# Patient Record
Sex: Female | Born: 1995 | Race: White | Hispanic: No | Marital: Single | Smoking: Never smoker
Health system: Southern US, Community
[De-identification: ages and names within clinical notes are randomized; demographics above are authoritative.]

## PROBLEM LIST (undated history)

## (undated) DIAGNOSIS — F32A Depression, unspecified: Secondary | ICD-10-CM

## (undated) DIAGNOSIS — F909 Attention-deficit hyperactivity disorder, unspecified type: Secondary | ICD-10-CM

## (undated) DIAGNOSIS — F419 Anxiety disorder, unspecified: Secondary | ICD-10-CM

## (undated) DIAGNOSIS — N83209 Unspecified ovarian cyst, unspecified side: Secondary | ICD-10-CM

## (undated) HISTORY — PX: WISDOM TOOTH EXTRACTION: SHX21

## (undated) HISTORY — DX: Depression, unspecified: F32.A

## (undated) HISTORY — DX: Anxiety disorder, unspecified: F41.9

## (undated) HISTORY — DX: Unspecified ovarian cyst, unspecified side: N83.209

## (undated) HISTORY — DX: Attention-deficit hyperactivity disorder, unspecified type: F90.9

## (undated) HISTORY — PX: TONSILLECTOMY: SUR1361

---
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
## 2016-05-24 ENCOUNTER — Encounter: Payer: 59 | Admitting: Women's Health

## 2016-05-31 ENCOUNTER — Encounter: Payer: 59 | Admitting: Women's Health

## 2016-06-05 ENCOUNTER — Telehealth: Payer: Self-pay

## 2016-06-05 ENCOUNTER — Encounter: Payer: Self-pay | Admitting: Women's Health

## 2016-06-05 ENCOUNTER — Ambulatory Visit (INDEPENDENT_AMBULATORY_CARE_PROVIDER_SITE_OTHER): Payer: 59 | Admitting: Women's Health

## 2016-06-05 VITALS — BP 116/74 | Ht 70.0 in | Wt 197.0 lb

## 2016-06-05 DIAGNOSIS — Z3009 Encounter for other general counseling and advice on contraception: Secondary | ICD-10-CM | POA: Diagnosis not present

## 2016-06-05 DIAGNOSIS — Z01419 Encounter for gynecological examination (general) (routine) without abnormal findings: Secondary | ICD-10-CM | POA: Diagnosis not present

## 2016-06-05 LAB — CBC WITH DIFFERENTIAL/PLATELET
Basophils Absolute: 0 cells/uL (ref 0–200)
Basophils Relative: 0 %
EOS PCT: 3 %
Eosinophils Absolute: 138 cells/uL (ref 15–500)
HEMATOCRIT: 41.2 % (ref 35.0–45.0)
HEMOGLOBIN: 13.8 g/dL (ref 11.7–15.5)
LYMPHS ABS: 1748 {cells}/uL (ref 850–3900)
Lymphocytes Relative: 38 %
MCH: 29.4 pg (ref 27.0–33.0)
MCHC: 33.5 g/dL (ref 32.0–36.0)
MCV: 87.8 fL (ref 80.0–100.0)
MONO ABS: 506 {cells}/uL (ref 200–950)
MPV: 10.4 fL (ref 7.5–12.5)
Monocytes Relative: 11 %
NEUTROS ABS: 2208 {cells}/uL (ref 1500–7800)
NEUTROS PCT: 48 %
Platelets: 301 10*3/uL (ref 140–400)
RBC: 4.69 MIL/uL (ref 3.80–5.10)
RDW: 13.1 % (ref 11.0–15.0)
WBC: 4.6 10*3/uL (ref 3.8–10.8)

## 2016-06-05 MED ORDER — ETONOGESTREL-ETHINYL ESTRADIOL 0.12-0.015 MG/24HR VA RING: VAGINAL_RING | VAGINAL | 4 refills | Status: DC

## 2016-06-05 NOTE — Progress Notes (Signed)
[facility] 05/10/1996 [id number]    History:    Presents for annual exam.  Monthly cycle on NuvaRing without complaint. Same partner negative STD screen. Gardasil series completed.  Past medical history, past surgical history, family history and social history were all reviewed and documented in the EPIC chart. 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   ROS:  A ROS was performed and pertinent positives and negatives are included.  Exam:  Vitals:   06/05/16 1002  BP: 116/74  Weight: 197 lb (89.4 kg)  Height: 5\' 10"  (1.778 m)   Body mass index is 28.27 kg/m.   General appearance:  Normal Thyroid:  Symmetrical, normal in size, without palpable masses or nodularity. Respiratory  Auscultation:  Clear without wheezing or rhonchi Cardiovascular  Auscultation:  Regular rate, without rubs, murmurs or gallops  Edema/varicosities:  Not grossly evident Abdominal  Soft,nontender, without masses, guarding or rebound.  Liver/spleen:  No organomegaly noted  Hernia:  None appreciated  Skin  Inspection:  Grossly normal   Breasts: Examined lying and sitting.     Right: Without masses, retractions, discharge or axillary adenopathy.     Left: Without masses, retractions, discharge or axillary adenopathy. Gentitourinary   Inguinal/mons:  Normal without inguinal adenopathy  External genitalia:  Normal  BUS/Urethra/Skene's glands:  Normal  Vagina:  Normal  Cervix:  Normal  Uterus:   normal in size, shape and contour.  Midline and mobile  Adnexa/parametria:     Rt: Without masses or tenderness.   Lt: Without masses or tenderness.  Anus and perineum: Normal    Assessment/Plan:  20 y.o. S WF G0 for annual exam with no complaints.  Monthly cycle on NuvaRing Contraception management  Plan: Contraception options reviewed would like to try Nexplanon will check coverage Dr. Lily PeerFernandez place with next cycle. SBE's, exercise, calcium rich diet, MVI daily encouraged. Campus safety  reviewed. Will continue NuvaRing until Nexplanon placed. CBC, UHarrington Challenger.    Keishawna Carranza [facility], 10:23 AM 06/05/2016

## 2016-06-05 NOTE — Patient Instructions (Signed)
Health Maintenance, Female Adopting a healthy lifestyle and getting preventive care can go a long way to promote health and wellness. Talk with your health care provider about what schedule of regular examinations is right for you. This is a good chance for you to check in with your provider about disease prevention and staying healthy. In between checkups, there are plenty of things you can do on your own. Experts have done a lot of research about which lifestyle changes and preventive measures are most likely to keep you healthy. Ask your health care provider for more information. WEIGHT AND DIET  Eat a healthy diet  Be sure to include plenty of vegetables, fruits, low-fat dairy products, and lean protein.  Do not eat a lot of foods high in solid fats, added sugars, or salt.  Get regular exercise. This is one of the most important things you can do for your health.  Most adults should exercise for at least 150 minutes each week. The exercise should increase your heart rate and make you sweat (moderate-intensity exercise).  Most adults should also do strengthening exercises at least twice a week. This is in addition to the moderate-intensity exercise.  Maintain a healthy weight  Body mass index (BMI) is a measurement that can be used to identify possible weight problems. It estimates body fat based on height and weight. Your health care provider can help determine your BMI and help you achieve or maintain a healthy weight.  For females 20 years of age and older:   A BMI below 18.5 is considered underweight.  A BMI of 18.5 to 24.9 is normal.  A BMI of 25 to 29.9 is considered overweight.  A BMI of 30 and above is considered obese.  Watch levels of cholesterol and blood lipids  You should start having your blood tested for lipids and cholesterol at 20 years of age, then have this test every 5 years.  You may need to have your cholesterol levels checked more often if:  Your lipid  or cholesterol levels are high.  You are older than 19 years of age.  You are at high risk for heart disease.  CANCER SCREENING   Lung Cancer  Lung cancer screening is recommended for adults 55-80 years old who are at high risk for lung cancer because of a history of smoking.  A yearly low-dose CT scan of the lungs is recommended for people who:  Currently smoke.  Have quit within the past 15 years.  Have at least a 30-pack-year history of smoking. A pack year is smoking an average of one pack of cigarettes a day for 1 year.  Yearly screening should continue until it has been 15 years since you quit.  Yearly screening should stop if you develop a health problem that would prevent you from having lung cancer treatment.  Breast Cancer  Practice breast self-awareness. This means understanding how your breasts normally appear and feel.  It also means doing regular breast self-exams. Let your health care provider know about any changes, no matter how small.  If you are in your 20s or 30s, you should have a clinical breast exam (CBE) by a health care provider every 1-3 years as part of a regular health exam.  If you are 40 or older, have a CBE every year. Also consider having a breast X-ray (mammogram) every year.  If you have a family history of breast cancer, talk to your health care provider about genetic screening.  If you   are at high risk for breast cancer, talk to your health care provider about having an MRI and a mammogram every year.  Breast cancer gene (BRCA) assessment is recommended for women who have family members with BRCA-related cancers. BRCA-related cancers include:  Breast.  Ovarian.  Tubal.  Peritoneal cancers.  Results of the assessment will determine the need for genetic counseling and BRCA1 and BRCA2 testing. Cervical Cancer Your health care provider may recommend that you be screened regularly for cancer of the pelvic organs (ovaries, uterus, and  vagina). This screening involves a pelvic examination, including checking for microscopic changes to the surface of your cervix (Pap test). You may be encouraged to have this screening done every 3 years, beginning at age 21.  For women ages 30-65, health care providers may recommend pelvic exams and Pap testing every 3 years, or they may recommend the Pap and pelvic exam, combined with testing for human papilloma virus (HPV), every 5 years. Some types of HPV increase your risk of cervical cancer. Testing for HPV may also be done on women of any age with unclear Pap test results.  Other health care providers may not recommend any screening for nonpregnant women who are considered low risk for pelvic cancer and who do not have symptoms. Ask your health care provider if a screening pelvic exam is right for you.  If you have had past treatment for cervical cancer or a condition that could lead to cancer, you need Pap tests and screening for cancer for at least 20 years after your treatment. If Pap tests have been discontinued, your risk factors (such as having a new sexual partner) need to be reassessed to determine if screening should resume. Some women have medical problems that increase the chance of getting cervical cancer. In these cases, your health care provider may recommend more frequent screening and Pap tests. Colorectal Cancer  This type of cancer can be detected and often prevented.  Routine colorectal cancer screening usually begins at 19 years of age and continues through 19 years of age.  Your health care provider may recommend screening at an earlier age if you have risk factors for colon cancer.  Your health care provider may also recommend using home test kits to check for hidden blood in the stool.  A small camera at the end of a tube can be used to examine your colon directly (sigmoidoscopy or colonoscopy). This is done to check for the earliest forms of colorectal  cancer.  Routine screening usually begins at age 50.  Direct examination of the colon should be repeated every 5-10 years through 19 years of age. However, you may need to be screened more often if early forms of precancerous polyps or small growths are found. Skin Cancer  Check your skin from head to toe regularly.  Tell your health care provider about any new moles or changes in moles, especially if there is a change in a mole's shape or color.  Also tell your health care provider if you have a mole that is larger than the size of a pencil eraser.  Always use sunscreen. Apply sunscreen liberally and repeatedly throughout the day.  Protect yourself by wearing long sleeves, pants, a wide-brimmed hat, and sunglasses whenever you are outside. HEART DISEASE, DIABETES, AND HIGH BLOOD PRESSURE   High blood pressure causes heart disease and increases the risk of stroke. High blood pressure is more likely to develop in:  People who have blood pressure in the high end   of the normal range (130-139/85-89 mm Hg).  People who are overweight or obese.  People who are African American.  If you are 38-23 years of age, have your blood pressure checked every 3-5 years. If you are 61 years of age or older, have your blood pressure checked every year. You should have your blood pressure measured twice--once when you are at a hospital or clinic, and once when you are not at a hospital or clinic. Record the average of the two measurements. To check your blood pressure when you are not at a hospital or clinic, you can use:  An automated blood pressure machine at a pharmacy.  A home blood pressure monitor.  If you are between 45 years and 39 years old, ask your health care provider if you should take aspirin to prevent strokes.  Have regular diabetes screenings. This involves taking a blood sample to check your fasting blood sugar level.  If you are at a normal weight and have a low risk for diabetes,  have this test once every three years after 19 years of age.  If you are overweight and have a high risk for diabetes, consider being tested at a younger age or more often. PREVENTING INFECTION  Hepatitis B  If you have a higher risk for hepatitis B, you should be screened for this virus. You are considered at high risk for hepatitis B if:  You were born in a country where hepatitis B is common. Ask your health care provider which countries are considered high risk.  Your parents were born in a high-risk country, and you have not been immunized against hepatitis B (hepatitis B vaccine).  You have HIV or AIDS.  You use needles to inject street drugs.  You live with someone who has hepatitis B.  You have had sex with someone who has hepatitis B.  You get hemodialysis treatment.  You take certain medicines for conditions, including cancer, organ transplantation, and autoimmune conditions. Hepatitis C  Blood testing is recommended for:  Everyone born from [birth year] through 1964.  Anyone with known risk factors for hepatitis C. Sexually transmitted infections (STIs)  You should be screened for sexually transmitted infections (STIs) including gonorrhea and chlamydia if:  You are sexually active and are younger than 19 years of age.  You are older than 19 years of age and your health care provider tells you that you are at risk for this type of infection.  Your sexual activity has changed since you were last screened and you are at an increased risk for chlamydia or gonorrhea. Ask your health care provider if you are at risk.  If you do not have HIV, but are at risk, it may be recommended that you take a prescription medicine daily to prevent HIV infection. This is called pre-exposure prophylaxis (PrEP). You are considered at risk if:  You are sexually active and do not regularly use condoms or know the HIV status of your partner(s).  You take drugs by injection.  You are sexually  active with a partner who has HIV. Talk with your health care provider about whether you are at high risk of being infected with HIV. If you choose to begin PrEP, you should first be tested for HIV. You should then be tested every 3 months for as long as you are taking PrEP.  PREGNANCY   If you are premenopausal and you may become pregnant, ask your health care provider about preconception counseling.  If you may  become pregnant, take 400 to 800 micrograms (mcg) of folic acid every day.  If you want to prevent pregnancy, talk to your health care provider about birth control (contraception). OSTEOPOROSIS AND MENOPAUSE   Osteoporosis is a disease in which the bones lose minerals and strength with aging. This can result in serious bone fractures. Your risk for osteoporosis can be identified using a bone density scan.  If you are 33 years of age or older, or if you are at risk for osteoporosis and fractures, ask your health care provider if you should be screened.  Ask your health care provider whether you should take a calcium or vitamin D supplement to lower your risk for osteoporosis.  Menopause may have certain physical symptoms and risks.  Hormone replacement therapy may reduce some of these symptoms and risks. Talk to your health care provider about whether hormone replacement therapy is right for you.  HOME CARE INSTRUCTIONS   Schedule regular health, dental, and eye exams.  Stay current with your immunizations.   Do not use any tobacco products including cigarettes, chewing tobacco, or electronic cigarettes.  If you are pregnant, do not drink alcohol.  If you are breastfeeding, limit how much and how often you drink alcohol.  Limit alcohol intake to no more than 1 drink per day for nonpregnant women. One drink equals 12 ounces of beer, 5 ounces of wine, or 1 ounces of hard liquor.  Do not use street drugs.  Do not share needles.  Ask your health care provider for help if  you need support or information about quitting drugs.  Tell your health care provider if you often feel depressed.  Tell your health care provider if you have ever been abused or do not feel safe at home.   This information is not intended to replace advice given to you by your health care provider. Make sure you discuss any questions you have with your health care provider.   Document Released: 04/15/2011 Document Revised: 10/21/2014 Document Reviewed: 09/01/2013 Elsevier Interactive Patient Education 2016 [place] What is this medicine? ETONOGESTREL (et oh noe JES trel) is a contraceptive (birth control) device. It is used to prevent pregnancy. It can be used for up to 3 years. This medicine may be used for other purposes; ask your health care provider or pharmacist if you have questions. What should I tell my health care provider before I take this medicine? They need to know if you have any of these conditions: -abnormal vaginal bleeding -blood vessel disease or blood clots -cancer of the breast, cervix, or liver -depression -diabetes -gallbladder disease -headaches -heart disease or recent heart attack -high blood pressure -high cholesterol -kidney disease -liver disease -renal disease -seizures -tobacco smoker -an unusual or allergic reaction to etonogestrel, other hormones, anesthetics or antiseptics, medicines, foods, dyes, or preservatives -pregnant or trying to get pregnant -breast-feeding How should I use this medicine? This device is inserted just under the skin on the inner side of your upper arm by a health care professional. Talk to your pediatrician regarding the use of this medicine in children. Special care may be needed. Overdosage: If you think you have taken too much of this medicine contact a poison control center or emergency room at once. NOTE: This medicine is only for you. Do not share this medicine with others. What if I  miss a dose? This does not apply. What may interact with this medicine? Do not take this medicine with any of the  following medications: -amprenavir -bosentan -fosamprenavir This medicine may also interact with the following medications: -barbiturate medicines for inducing sleep or treating seizures -certain medicines for fungal infections like ketoconazole and itraconazole -griseofulvin -medicines to treat seizures like carbamazepine, felbamate, oxcarbazepine, phenytoin, topiramate -modafinil -phenylbutazone -rifampin -some medicines to treat HIV infection like atazanavir, indinavir, lopinavir, nelfinavir, tipranavir, ritonavir -St. John's wort This list may not describe all possible interactions. Give your health care provider a list of all the medicines, herbs, non-prescription drugs, or dietary supplements you use. Also tell them if you smoke, drink alcohol, or use illegal drugs. Some items may interact with your medicine. What should I watch for while using this medicine? This product does not protect you against HIV infection (AIDS) or other sexually transmitted diseases. You should be able to feel the implant by pressing your fingertips over the skin where it was inserted. Contact your doctor if you cannot feel the implant, and use a non-hormonal birth control method (such as condoms) until your doctor confirms that the implant is in place. If you feel that the implant may have broken or become bent while in your arm, contact your healthcare provider. What side effects may I notice from receiving this medicine? Side effects that you should report to your doctor or health care professional as soon as possible: -allergic reactions like skin rash, itching or hives, swelling of the face, lips, or tongue -breast lumps -changes in emotions or moods -depressed mood -heavy or prolonged menstrual bleeding -pain, irritation, swelling, or bruising at the insertion site -scar at site of  insertion -signs of infection at the insertion site such as fever, and skin redness, pain or discharge -signs of pregnancy -signs and symptoms of a blood clot such as breathing problems; changes in vision; chest pain; severe, sudden headache; pain, swelling, warmth in the leg; trouble speaking; sudden numbness or weakness of the face, arm or leg -signs and symptoms of liver injury like dark yellow or brown urine; general ill feeling or flu-like symptoms; light-colored stools; loss of appetite; nausea; right upper belly pain; unusually weak or tired; yellowing of the eyes or skin -unusual vaginal bleeding, discharge -signs and symptoms of a stroke like changes in vision; confusion; trouble speaking or understanding; severe headaches; sudden numbness or weakness of the face, arm or leg; trouble walking; dizziness; loss of balance or coordination Side effects that usually do not require medical attention (Report these to your doctor or health care professional if they continue or are bothersome.): -acne -back pain -breast pain -changes in weight -dizziness -general ill feeling or flu-like symptoms -headache -irregular menstrual bleeding -nausea -sore throat -vaginal irritation or inflammation This list may not describe all possible side effects. Call your doctor for medical advice about side effects. You may report side effects to FDA at 1-800-FDA-1088. Where should I keep my medicine? This drug is given in a hospital or clinic and will not be stored at home. NOTE: This sheet is a summary. It may not cover all possible information. If you have questions about this medicine, talk to your doctor, pharmacist, or health care provider.    2016, Elsevier/Gold Standard. (2014-07-15 14:07:06)

## 2016-06-05 NOTE — Telephone Encounter (Signed)
I called patient and left message that [facility] and they cover the Nexplanon [phone] and insertion ([phone]) at 100%.  No precert required. [facility] 06[facility]16 Call ref [phone]

## 2016-06-06 LAB — URINALYSIS W MICROSCOPIC + REFLEX CULTURE
Bacteria, UA: NONE SEEN [HPF]
Bilirubin Urine: NEGATIVE
CASTS: NONE SEEN [LPF]
CRYSTALS: NONE SEEN [HPF]
Glucose, UA: NEGATIVE
HGB URINE DIPSTICK: NEGATIVE
Ketones, ur: NEGATIVE
Leukocytes, UA: NEGATIVE
NITRITE: NEGATIVE
PH: 6.5 (ref 5.0–8.0)
Protein, ur: NEGATIVE
RBC / HPF: NONE SEEN RBC/HPF (ref <=2)
SPECIFIC GRAVITY, URINE: 1.009 (ref 1.001–1.035)
Squamous Epithelial / LPF: NONE SEEN [HPF] (ref <=5)
WBC, UA: NONE SEEN WBC/HPF (ref <=5)
YEAST: NONE SEEN [HPF]

## 2016-07-02 ENCOUNTER — Encounter: Payer: Self-pay | Admitting: Gynecology

## 2016-07-02 ENCOUNTER — Ambulatory Visit (INDEPENDENT_AMBULATORY_CARE_PROVIDER_SITE_OTHER): Payer: 59 | Admitting: Gynecology

## 2016-07-02 VITALS — BP 118/80 | Ht 70.0 in | Wt 197.0 lb

## 2016-07-02 DIAGNOSIS — Z975 Presence of (intrauterine) contraceptive device: Secondary | ICD-10-CM | POA: Insufficient documentation

## 2016-07-02 DIAGNOSIS — Z30017 Encounter for initial prescription of implantable subdermal contraceptive: Secondary | ICD-10-CM | POA: Diagnosis not present

## 2016-07-02 NOTE — Patient Instructions (Signed)
Etonogestrel implant What is this medicine? ETONOGESTREL (et oh noe JES trel) is a contraceptive (birth control) device. It is used to prevent pregnancy. It can be used for up to 3 years. This medicine may be used for other purposes; ask your health care provider or pharmacist if you have questions. What should I tell my health care provider before I take this medicine? They need to know if you have any of these conditions: -abnormal vaginal bleeding -blood vessel disease or blood clots -cancer of the breast, cervix, or liver -depression -diabetes -gallbladder disease -headaches -heart disease or recent heart attack -high blood pressure -high cholesterol -kidney disease -liver disease -renal disease -seizures -tobacco smoker -an unusual or allergic reaction to etonogestrel, other hormones, anesthetics or antiseptics, medicines, foods, dyes, or preservatives -pregnant or trying to get pregnant -breast-feeding How should I use this medicine? This device is inserted just under the skin on the inner side of your upper arm by a health care professional. Talk to your pediatrician regarding the use of this medicine in children. Special care may be needed. Overdosage: If you think you have taken too much of this medicine contact a poison control center or emergency room at once. NOTE: This medicine is only for you. Do not share this medicine with others. What if I miss a dose? This does not apply. What may interact with this medicine? Do not take this medicine with any of the following medications: -amprenavir -bosentan -fosamprenavir This medicine may also interact with the following medications: -barbiturate medicines for inducing sleep or treating seizures -certain medicines for fungal infections like ketoconazole and itraconazole -griseofulvin -medicines to treat seizures like carbamazepine, felbamate, oxcarbazepine, phenytoin,  topiramate -modafinil -phenylbutazone -rifampin -some medicines to treat HIV infection like atazanavir, indinavir, lopinavir, nelfinavir, tipranavir, ritonavir -St. John's wort This list may not describe all possible interactions. Give your health care provider a list of all the medicines, herbs, non-prescription drugs, or dietary supplements you use. Also tell them if you smoke, drink alcohol, or use illegal drugs. Some items may interact with your medicine. What should I watch for while using this medicine? This product does not protect you against HIV infection (AIDS) or other sexually transmitted diseases. You should be able to feel the implant by pressing your fingertips over the skin where it was inserted. Contact your doctor if you cannot feel the implant, and use a non-hormonal birth control method (such as condoms) until your doctor confirms that the implant is in place. If you feel that the implant may have broken or become bent while in your arm, contact your healthcare provider. What side effects may I notice from receiving this medicine? Side effects that you should report to your doctor or health care professional as soon as possible: -allergic reactions like skin rash, itching or hives, swelling of the face, lips, or tongue -breast lumps -changes in emotions or moods -depressed mood -heavy or prolonged menstrual bleeding -pain, irritation, swelling, or bruising at the insertion site -scar at site of insertion -signs of infection at the insertion site such as fever, and skin redness, pain or discharge -signs of pregnancy -signs and symptoms of a blood clot such as breathing problems; changes in vision; chest pain; severe, sudden headache; pain, swelling, warmth in the leg; trouble speaking; sudden numbness or weakness of the face, arm or leg -signs and symptoms of liver injury like dark yellow or brown urine; general ill feeling or flu-like symptoms; light-colored stools; loss of  appetite; nausea; right upper belly   pain; unusually weak or tired; yellowing of the eyes or skin -unusual vaginal bleeding, discharge -signs and symptoms of a stroke like changes in vision; confusion; trouble speaking or understanding; severe headaches; sudden numbness or weakness of the face, arm or leg; trouble walking; dizziness; loss of balance or coordination Side effects that usually do not require medical attention (Report these to your doctor or health care professional if they continue or are bothersome.): -acne -back pain -breast pain -changes in weight -dizziness -general ill feeling or flu-like symptoms -headache -irregular menstrual bleeding -nausea -sore throat -vaginal irritation or inflammation This list may not describe all possible side effects. Call your doctor for medical advice about side effects. You may report side effects to FDA at 1-800-FDA-1088. Where should I keep my medicine? This drug is given in a hospital or clinic and will not be stored at home. NOTE: This sheet is a summary. It may not cover all possible information. If you have questions about this medicine, talk to your doctor, pharmacist, or health care provider.    2016, Elsevier/Gold Standard. (2014-07-15 14:07:06)  

## 2016-07-02 NOTE — Progress Notes (Signed)
   Patient is a 20 year old who presented to the office today for placement of the Nexplanon for contraception. She had been using the NuvaRing in the past but did not want to remember to have to change the reading every month. She completed the Gardasil series in the past she's currently menstruating. Should been provided with literature information on this form of contraception. She is fully where this could for 3 years. The risks benefits and pros and cons were discussed and literature information was provided. She was offered the flu vaccine today but declined.                                              Nexplanon Procedure Note (insertion)     The patient was laying on her back with her nondominant arm (left) flexed at the elbow and externally rotated. The insertion site was identified as the underside of the nondominant upper arm approximately 8 cm from the medial epicondyle of the humerus. 2 marks were made with a sterile marker: The first marked the spot where the Nexplanon  implant was to be inserted, and a second, marked a spot a few centimeters proximal to the first marke to guide the direction of the insertion. The area was cleansed with Betadine solution. The area was anesthetized with 1% lidocaine  (1 cc)  at the area the injection site and underneath the skin along the planned insertion tunnel. The preloaded disposable Nexplanon was removed from its sterile casing.  The applicator was held above the needle at the textured surface area. The transparent protector was removed. With a freehand, the skin was stretched around the insertion site with a thumb and index finger. The skin was then punctured with the tip of the needle angled at 30. The Nexplanon applicator was lowered to a horizontal position. While lifting the skin with the tip of the needle the needle was then slid to its full length. The applicator was kept in sitting position with a needle inserted to its full length. The  purple slider was unlocked by pushing it slightly downward. The slider was fully moved back until it stopped. This allowed the implant to be in the final subdermal position and the needle was locked inside the body of the applicator. The applicator was then removed. A Steri-Strip was made over the incision and a Kerlix wrap was placed which patient is to remove tomorrow. No complications patient tolerated procedure well and was released home with instructions.   [facility] Memorial HospitalFERNANDEZ,Ambreen Tufte HMD3:45 PMTD@

## 2016-07-03 ENCOUNTER — Encounter: Payer: Self-pay | Admitting: Anesthesiology

## 2017-02-26 ENCOUNTER — Encounter: Payer: Self-pay | Admitting: Gynecology

## 2017-06-06 ENCOUNTER — Encounter: Payer: 59 | Admitting: Women's Health

## 2017-06-10 ENCOUNTER — Ambulatory Visit (INDEPENDENT_AMBULATORY_CARE_PROVIDER_SITE_OTHER): Payer: 59 | Admitting: Women's Health

## 2017-06-10 ENCOUNTER — Encounter: Payer: Self-pay | Admitting: Women's Health

## 2017-06-10 VITALS — BP 122/80 | Ht 70.0 in | Wt 206.0 lb

## 2017-06-10 DIAGNOSIS — Z01419 Encounter for gynecological examination (general) (routine) without abnormal findings: Secondary | ICD-10-CM | POA: Diagnosis not present

## 2017-06-10 DIAGNOSIS — Z113 Encounter for screening for infections with a predominantly sexual mode of transmission: Secondary | ICD-10-CM | POA: Diagnosis not present

## 2017-06-10 LAB — URINALYSIS W MICROSCOPIC + REFLEX CULTURE
Bacteria, UA: NONE SEEN [HPF]
Bilirubin Urine: NEGATIVE
CRYSTALS: NONE SEEN [HPF]
Casts: NONE SEEN [LPF]
Glucose, UA: NEGATIVE
Hgb urine dipstick: NEGATIVE
Ketones, ur: NEGATIVE
Leukocytes, UA: NEGATIVE
Nitrite: NEGATIVE
PROTEIN: NEGATIVE
SPECIFIC GRAVITY, URINE: 1.022 (ref 1.001–1.035)
YEAST: NONE SEEN [HPF]
pH: 6.5 (ref 5.0–8.0)

## 2017-06-10 NOTE — Patient Instructions (Signed)

## 2017-06-10 NOTE — Progress Notes (Signed)
Selena Murray 12-18-1995 [id number]    History:    Presents for annual exam.  Rare bleeding with Nexplanon placed 06/2016. Gardasil series completed. Not sexually active, requests STD screen.  Past medical history, past surgical history, family history and social history were all reviewed and documented in the EPIC chart. Inherited some money from her grandfather and bought a home and is doing well. 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  ROS:  A ROS was performed and pertinent positives and negatives are included.  Exam:  Vitals:   06/10/17 0839  BP: 122/80  Weight: 206 lb (93.4 kg)  Height: 5\' 10"  (1.778 m)   Body mass index is 29.56 kg/m.   General appearance:  Normal Thyroid:  Symmetrical, normal in size, without palpable masses or nodularity. Respiratory  Auscultation:  Clear without wheezing or rhonchi Cardiovascular  Auscultation:  Regular rate, without rubs, murmurs or gallops  Edema/varicosities:  Not grossly evident Abdominal  Soft,nontender, without masses, guarding or rebound.  Liver/spleen:  No organomegaly noted  Hernia:  None appreciated  Skin  Inspection:  Grossly normal   Breasts: Examined lying and sitting.     Right: Without masses, retractions, discharge or axillary adenopathy.     Left: Without masses, retractions, discharge or axillary adenopathy. Gentitourinary   Inguinal/mons:  Normal without inguinal adenopathy  External genitalia:  Normal  BUS/Urethra/Skene's glands:  Normal  Vagina:  Normal  Cervix:  Normal  Uterus: normal in size, shape and contour.  Midline and mobile  Adnexa/parametria:     Rt: Without masses or tenderness.   Lt: Without masses or tenderness.  Anus and perineum: Normal    Assessment/Plan:  21 y.o. S WF G0 for annual exam with no complaints.  06/2016 Nexplanon rare spotting STD screen Obesity  Plan: SBE's, exercise, calcium rich diet, MVI daily encouraged, reviewed importance of decreasing  calories, increasing exercise for health. CBC, GC/Chlamydia, HIV, hep B, C, RPR. Reviewed importance of condoms until permanent partner. Campus safety reviewed.  Harrington Challenger [facility], 9:32 AM 06/10/2017

## 2017-06-11 LAB — URINE CULTURE: Organism ID, Bacteria: NO GROWTH

## 2017-06-11 LAB — GC/CHLAMYDIA PROBE AMP
CT Probe RNA: NOT DETECTED
GC Probe RNA: NOT DETECTED

## 2018-06-17 ENCOUNTER — Ambulatory Visit: Payer: 59 | Admitting: Women's Health

## 2018-06-17 ENCOUNTER — Encounter: Payer: Self-pay | Admitting: Women's Health

## 2018-06-17 VITALS — BP 116/74 | Ht 69.0 in | Wt 197.0 lb

## 2018-06-17 DIAGNOSIS — Z01419 Encounter for gynecological examination (general) (routine) without abnormal findings: Secondary | ICD-10-CM

## 2018-06-17 DIAGNOSIS — Z113 Encounter for screening for infections with a predominantly sexual mode of transmission: Secondary | ICD-10-CM | POA: Diagnosis not present

## 2018-06-17 NOTE — Addendum Note (Signed)
Addended by: Dayna Barker on: 06/17/2018 09:33 AM   Modules accepted: Orders

## 2018-06-17 NOTE — Patient Instructions (Signed)

## 2018-06-17 NOTE — Progress Notes (Signed)
Selena Murray May 13, 1996 [id number]    History:    Presents for annual exam.  06/2016 Nexplanon with rare spotting.  Gardasil series completed.  New Partner  Past medical history, past surgical history, family history and social history were all reviewed and documented in the EPIC chart.  Attending school [employer] education.  Working night shift at [employer].  Parents healthy.  ROS:  A ROS was performed and pertinent positives and negatives are included.  Exam:  Vitals:   06/17/18 0856  BP: 116/74  Weight: 197 lb (89.4 kg)  Height: 5\' 9"  (1.753 m)   Body mass index is 29.09 kg/m.   General appearance:  Normal Thyroid:  Symmetrical, normal in size, without palpable masses or nodularity. Respiratory  Auscultation:  Clear without wheezing or rhonchi Cardiovascular  Auscultation:  Regular rate, without rubs, murmurs or gallops  Edema/varicosities:  Not grossly evident Abdominal  Soft,nontender, without masses, guarding or rebound.  Liver/spleen:  No organomegaly noted  Hernia:  None appreciated  Skin  Inspection:  Grossly normal   Breasts: Examined lying and sitting.     Right: Without masses, retractions, discharge or axillary adenopathy.     Left: Without masses, retractions, discharge or axillary adenopathy. Gentitourinary   Inguinal/mons:  Normal without inguinal adenopathy  External genitalia:  Normal  BUS/Urethra/Skene's glands:  Normal  Vagina:  Normal  Cervix:  Normal  Uterus:   normal in size, shape and contour.  Midline and mobile  Adnexa/parametria:     Rt: Without masses or tenderness.   Lt: Without masses or tenderness.  Anus and perineum: Normal    Assessment/Plan:  22 y.o. SWF G0 for annual exam no complaints.  06/2016 Nexplanon rare spotting STD screen  Plan: SBE's, exercise, calcium rich foods, MVI daily encouraged.  Congratulated on 10 pound weight loss continue to watch simple carbs and calories.  Safety reviewed.  CBC, GC/chlamydia, HIV, hep B,  C, RPR.  Pap.   Harrington Challenger [facility], 9:05 AM 06/17/2018

## 2018-06-18 ENCOUNTER — Other Ambulatory Visit: Payer: Self-pay | Admitting: Women's Health

## 2018-06-18 LAB — CBC WITH DIFFERENTIAL/PLATELET
BASOS PCT: 0.6 %
Basophils Absolute: 40 cells/uL (ref 0–200)
Eosinophils Absolute: 121 cells/uL (ref 15–500)
Eosinophils Relative: 1.8 %
HCT: 41.9 % (ref 35.0–45.0)
Hemoglobin: 14.2 g/dL (ref 11.7–15.5)
LYMPHS ABS: 3142 {cells}/uL (ref 850–3900)
MCH: 29.6 pg (ref 27.0–33.0)
MCHC: 33.9 g/dL (ref 32.0–36.0)
MCV: 87.5 fL (ref 80.0–100.0)
MPV: 10.5 fL (ref 7.5–12.5)
Monocytes Relative: 8.4 %
NEUTROS ABS: 2834 {cells}/uL (ref 1500–7800)
NEUTROS PCT: 42.3 %
PLATELETS: 336 10*3/uL (ref 140–400)
RBC: 4.79 10*6/uL (ref 3.80–5.10)
RDW: 11.8 % (ref 11.0–15.0)
TOTAL LYMPHOCYTE: 46.9 %
WBC mixed population: 563 cells/uL (ref 200–950)
WBC: 6.7 10*3/uL (ref 3.8–10.8)

## 2018-06-18 LAB — C. TRACHOMATIS/N. GONORRHOEAE RNA
C. TRACHOMATIS RNA, TMA: DETECTED — AB
N. gonorrhoeae RNA, TMA: NOT DETECTED

## 2018-06-18 LAB — HEPATITIS C ANTIBODY
Hepatitis C Ab: NONREACTIVE
SIGNAL TO CUT-OFF: 0.04 (ref <1.00)

## 2018-06-18 LAB — HEPATITIS B SURFACE ANTIGEN: HEP B S AG: NONREACTIVE

## 2018-06-18 LAB — RPR: RPR: NONREACTIVE

## 2018-06-18 LAB — HIV ANTIBODY (ROUTINE TESTING W REFLEX): HIV: NONREACTIVE

## 2018-06-18 MED ORDER — AZITHROMYCIN 500 MG PO TABS: 1000.0000 mg | ORAL_TABLET | Freq: Once | ORAL | 0 refills | Status: AC

## 2018-06-20 LAB — PAP IG W/ RFLX HPV ASCU

## 2018-06-20 LAB — HUMAN PAPILLOMAVIRUS, HIGH RISK: HPV DNA High Risk: NOT DETECTED

## 2018-07-08 ENCOUNTER — Ambulatory Visit: Payer: 59 | Admitting: Women's Health

## 2018-07-08 ENCOUNTER — Encounter: Payer: Self-pay | Admitting: Women's Health

## 2018-07-08 VITALS — BP 120/82

## 2018-07-08 DIAGNOSIS — Z202 Contact with and (suspected) exposure to infections with a predominantly sexual mode of transmission: Secondary | ICD-10-CM | POA: Diagnosis not present

## 2018-07-08 MED ORDER — AZITHROMYCIN 500 MG PO TABS: 1000.0000 mg | ORAL_TABLET | Freq: Every day | ORAL | 0 refills | Status: DC

## 2018-07-08 NOTE — Patient Instructions (Signed)
Chlamydia, Female Chlamydia is an STD (sexually transmitted disease). This is an infection that spreads through sexual contact. If it is not treated, it can cause serious problems. It must be treated with antibiotic medicine. Sometimes, you may not have symptoms (asymptomatic). When you have symptoms, they can include:  Burning when you pee (urinate).  Peeing often.  Fluid (discharge) coming from the vagina.  Redness, soreness, and swelling (inflammation) of the butt (rectum).  Bleeding or fluid coming from the butt.  Belly (abdominal) pain.  Pain during sex.  Bleeding between periods.  Itching, burning, or redness in the eyes.  Fluid coming from the eyes.  Follow these instructions at home: Medicines  Take over-the-counter and prescription medicines only as told by your doctor.  Take your antibiotic medicine as told by your doctor. Do not stop taking the antibiotic even if you start to feel better. Sexual activity  Tell sex partners about your infection. Sex partners are people you had oral, anal, or vaginal sex with within 60 days of when you started getting sick. They need treatment, too.  Do not have sex until: ? You and your sex partners have been treated. ? Your doctor says it is okay.  If you have a single dose treatment, wait 7 days before having sex. General instructions  It is up to you to get your test results. Ask your doctor when your results will be ready.  Get a lot of rest.  Eat healthy foods.  Drink enough fluid to keep your pee (urine) clear or pale yellow.  Keep all follow-up visits as told by your doctor. You may need tests after 3 months. Preventing chlamydia  The only way to prevent chlamydia is not to have sex. To lower your risk: ? Use latex condoms correctly. Do this every time you have sex. ? Avoid having many sex partners. ? Ask if your partner has been tested for STDs and if he or she had negative results. Contact a doctor if:  You  get new symptoms.  You do not get better with treatment.  You have a fever or chills.  You have pain during sex. Get help right away if:  Your pain gets worse and does not get better with medicine.  You get flu-like symptoms, such as: ? Night sweats. ? Sore throat. ? Muscle aches.  You feel sick to your stomach (nauseous).  You throw up (vomit).  You have trouble swallowing.  You have bleeding: ? Between periods. ? After sex.  You have irregular periods.  You have belly pain that does not get better with medicine.  You have lower back pain that does not get better with medicine.  You feel weak or dizzy.  You pass out (faint).  You are pregnant and you get symptoms of chlamydia. Summary  Chlamydia is an infection that spreads through sexual contact.  Sometimes, chlamydia can cause no symptoms (asymptomatic).  Do not have sex until your doctor says it is okay.  All sex partners will have to be treated for chlamydia. This information is not intended to replace advice given to you by your health care provider. Make sure you discuss any questions you have with your health care provider. Document Released: 07/09/2008 Document Revised: 09/19/2016 Document Reviewed: 09/19/2016 Elsevier Interactive Patient Education  2017 Elsevier Inc.  

## 2018-07-08 NOTE — Progress Notes (Signed)
22 year old S WF G0 presents for test to cure chlamydia, was treated 3 weeks ago, had intercourse with partner last week, condom came off several days prior to partner being treated.  States thinks she was reexposed, states unable to get through to cancel appointment today.  Denies abdominal pain, discharge, urinary symptoms or fever.  Chlamydia found at annual exam, asymptomatic.  Nexplanon rare bleeding.  Exam: Appears well.  Probable re-exposure to chlamydia  Plan: Zithromax 1 g. x1 dose return to office in 3 weeks for test of cure, reviewed importance of abstaining until test of cure.

## 2018-07-28 ENCOUNTER — Encounter: Payer: Self-pay | Admitting: Women's Health

## 2018-07-28 ENCOUNTER — Ambulatory Visit (INDEPENDENT_AMBULATORY_CARE_PROVIDER_SITE_OTHER): Payer: 59 | Admitting: Women's Health

## 2018-07-28 VITALS — BP 126/80

## 2018-07-28 DIAGNOSIS — Z202 Contact with and (suspected) exposure to infections with a predominantly sexual mode of transmission: Secondary | ICD-10-CM | POA: Diagnosis not present

## 2018-07-28 NOTE — Addendum Note (Signed)
Addended by: Becky Sax on: 07/28/2018 11:55 AM   Modules accepted: Orders

## 2018-07-28 NOTE — Progress Notes (Signed)
22 year old S WF G0 00 presents for test of cure chlamydia.  Has abstained since treatment.  Had negative HIV, hepatitis and RPR.  Chlamydia had been asymptomatic found on annual exam.  Is no longer with past partner.  06/2016 Nexplanon has occasional irregular spotting.  No known health problems.  Gardasil series completed.  Exam: Appears well.  External genitalia within normal limits, speculum exam moderate amount of menses type blood noted, GC/Chlamydia culture taken.  Bimanual no CMT or adnexal tenderness.  Test of cure chlamydia  Plan: GC/Chlamydia culture pending.  Reviewed importance of condoms, safe dating reviewed.

## 2018-07-29 LAB — C. TRACHOMATIS/N. GONORRHOEAE RNA
C. TRACHOMATIS RNA, TMA: NOT DETECTED
N. gonorrhoeae RNA, TMA: NOT DETECTED

## 2018-08-01 ENCOUNTER — Emergency Department (HOSPITAL_COMMUNITY)
Admission: EM | Admit: 2018-08-01 | Discharge: 2018-08-01 | Disposition: A | Discharge status: Home / Self Care | Payer: 59 | Attending: Emergency Medicine | Admitting: Emergency Medicine

## 2018-08-01 ENCOUNTER — Encounter (HOSPITAL_COMMUNITY): Payer: Self-pay | Admitting: *Deleted

## 2018-08-01 ENCOUNTER — Other Ambulatory Visit: Payer: Self-pay

## 2018-08-01 DIAGNOSIS — J069 Acute upper respiratory infection, unspecified: Secondary | ICD-10-CM | POA: Diagnosis not present

## 2018-08-01 DIAGNOSIS — R590 Localized enlarged lymph nodes: Secondary | ICD-10-CM | POA: Insufficient documentation

## 2018-08-01 DIAGNOSIS — R05 Cough: Secondary | ICD-10-CM | POA: Diagnosis present

## 2018-08-01 DIAGNOSIS — J029 Acute pharyngitis, unspecified: Secondary | ICD-10-CM | POA: Insufficient documentation

## 2018-08-01 DIAGNOSIS — H9203 Otalgia, bilateral: Secondary | ICD-10-CM | POA: Insufficient documentation

## 2018-08-01 LAB — GROUP A STREP BY PCR: GROUP A STREP BY PCR: NOT DETECTED

## 2018-08-01 MED ORDER — IBUPROFEN 800 MG PO TABS: 800.0000 mg | ORAL_TABLET | Freq: Three times a day (TID) | ORAL | 0 refills | Status: DC

## 2018-08-01 MED ORDER — BENZONATATE 100 MG PO CAPS: 100.0000 mg | ORAL_CAPSULE | Freq: Three times a day (TID) | ORAL | 0 refills | Status: DC

## 2018-08-01 MED ORDER — FLUTICASONE PROPIONATE 50 MCG/ACT NA SUSP: 1.0000 | Freq: Every day | NASAL | 0 refills | Status: DC

## 2018-08-01 NOTE — ED Provider Notes (Signed)
[facility]-EMERGENCY DEPT Provider Note   CSN: [id number] Arrival date & time: 08/01/18  0131     History   Chief Complaint Chief Complaint  Patient presents with  . Sore Throat    HPI Selena Murray is a 22 y.o. female with a history of ADHD, ovarian cyst, who is s/p tonsillectomy presenting to the ED with complaints of URI symptoms for the past 5 days.  Patient reports congestion, rhinorrhea, sinus pressure, bilateral ear pressure, sore throat, and dry cough.  Calves are constant, somewhat alleviated by NyQuil which she took earlier this evening.  Discomfort in the throat is mild in severity.  No other specific alleviating or aggravating factors.  No other interventions trialed.  She reports some subjective fevers, no measured temps.  Denies nausea, vomiting, chest pain, dyspnea, drooling, or change in voice.   HPI  Past Medical History:  Diagnosis Date  . ADHD (attention deficit hyperactivity disorder)   . Ovarian cyst     Patient Active Problem List   Diagnosis Date Noted  . Nexplanon in place 07/02/2016    Past Surgical History:  Procedure Laterality Date  . TONSILLECTOMY    . WISDOM TOOTH EXTRACTION       OB History    Gravida  0   Para      Term      Preterm      AB      Living        SAB      TAB      Ectopic      Multiple      Live Births               Home Medications    Prior to Admission medications   Medication Sig Start Date End Date Taking? Authorizing Provider  etonogestrel (NEXPLANON) 68 MG IMPL implant 1 each by Subdermal route once.    [provider]    Family History Family History  Problem Relation Age of Onset  . Heart disease Maternal Grandmother   . Hyperlipidemia Maternal Grandmother   . Hypertension Maternal Grandmother     Social History Social History   Tobacco Use  . Smoking status: Never Smoker  . Smokeless tobacco: Never Used  Substance Use Topics  . Alcohol  use: Yes    Comment: Social  . Drug use: No     Allergies   Gold-containing drug products   Review of Systems Review of Systems  Constitutional: Negative for chills and fever.  HENT: Positive for congestion, ear pain (pressure), rhinorrhea, sinus pressure and sore throat. Negative for drooling, trouble swallowing and voice change.   Respiratory: Positive for cough. Negative for shortness of breath.   Cardiovascular: Negative for chest pain.  Gastrointestinal: Negative for nausea and vomiting.     Physical Exam Updated Vital Signs BP 118/73   Pulse 81   Temp 98.4 F (36.9 C) (Oral)   Resp 16   LMP 07/21/2018   SpO2 100%   Physical Exam  Constitutional: She appears well-developed and well-nourished. No distress.  HENT:  Head: Normocephalic and atraumatic.  Right Ear: No tenderness. No mastoid tenderness. Tympanic membrane is not perforated, not erythematous, not retracted and not bulging.  Left Ear: No tenderness. No mastoid tenderness. Tympanic membrane is not perforated, not erythematous, not retracted and not bulging.  Nose: Mucosal edema and rhinorrhea present.  Mouth/Throat: Uvula is midline. Posterior oropharyngeal erythema present. No oropharyngeal exudate.  Posterior oropharynx is symmetric  appearing. Patient tolerating own secretions without difficulty. No trismus. No drooling. No hot potato voice. No swelling beneath the tongue, submandibular compartment is soft.   Eyes: Pupils are equal, round, and reactive to light. Conjunctivae are normal. Right eye exhibits no discharge. Left eye exhibits no discharge.  Neck: Normal range of motion. Neck supple. No neck rigidity. No edema and no erythema present.  Cardiovascular: Normal rate and regular rhythm.  No murmur heard. Pulmonary/Chest: Breath sounds normal. No respiratory distress. She has no wheezes. She has no rales.  Abdominal: Soft. She exhibits no distension. There is no tenderness.  Lymphadenopathy:    She has  cervical adenopathy (mild anterior).  Neurological: She is alert.  Skin: Skin is warm and dry. No rash noted.  Psychiatric: She has a normal mood and affect. Her behavior is normal.  Nursing note and vitals reviewed.   ED Treatments / Results  Labs Results for orders placed or performed during the hospital encounter of 08/01/18  Group A Strep by PCR  Result Value Ref Range   Group A Strep by PCR NOT DETECTED NOT DETECTED   No results found. EKG None  Radiology No results found.  Procedures Procedures (including critical care time)  Medications Ordered in ED Medications - No data to display   Initial Impression / Assessment and Plan / ED Course  I have reviewed the triage vital signs and the nursing notes.  Pertinent labs & imaging results that were available during my care of the patient were reviewed by me and considered in my medical decision making (see chart for details).    Patient presents with URI type symptoms.  Patient is nontoxic appearing, in no apparent distress, vitals are WNL. Patient is afebrile in the ED, lungs are CTA, doubt pneumonia. There is no wheezing or signs of respiratory distress. Sxs onset < 7 days, afebrile, doubt acute bacterial sinusitis.  Strep test is negative.  No evidence of RPA/PTA on exam.  No evidence of AOM on exam. No meningeal signs. No history components or rashes to raise concern for tic borne illness. Suspect viral vs allergic etiology at this time and will treat supportively with Ibuprofen, Flonase, and Tessalon. I discussed results, treatment plan, need for PCP follow-up, and return precautions with the patient. Provided opportunity for questions, patient confirmed understanding and is in agreement with plan.    Final Clinical Impressions(s) / ED Diagnoses   Final diagnoses:  URI with cough and congestion    ED Discharge Orders         Ordered    ibuprofen (ADVIL,MOTRIN) 800 MG tablet  3 times daily     08/01/18 0341     fluticasone (FLONASE) 50 MCG/ACT nasal spray  Daily     08/01/18 0341    benzonatate (TESSALON) 100 MG capsule  Every 8 hours     08/01/18 0341           Tandre Conly R, PA-C 08/01/18 0342    Derwood Kaplan, MD 08/01/18 [phone]

## 2018-08-01 NOTE — Discharge Instructions (Signed)
You were seen in the emergency today for upper respiratory symptoms, we suspect your symptoms are related to allergies or a virus at this time.  Your strep test was negative.  I have prescribed you multiple medications to treat your symptoms.  ° °-Flonase to be used 1 spray in each nostril daily.  This medication is used to treat your congestion. ° °-Tessalon can be taken once every 8 hours as needed.  This medication is used to treat your cough. ° °-Ibuprofen to be taken once every 8 hours as needed for pain. Please take this medicine with food as it can cause stomach upset and at worst stomach bleeding. Do not take other NSAIDs such as motrin, aleve, advil, naproxen, mobic, etc as they are similar. You make take tylenol per over the counter dosing with this medicine safely. ° °We have prescribed you new medication(s) today. Discuss the medications prescribed today with your pharmacist as they can have adverse effects and interactions with your other medicines including over the counter and prescribed medications. Seek medical evaluation if you start to experience new or abnormal symptoms after taking one of these medicines, seek care immediately if you start to experience difficulty breathing, feeling of your throat closing, facial swelling, or rash as these could be indications of a more serious allergic reaction ° °You will need to follow-up with your primary care provider in 1 week if your symptoms have not improved.  If you do not have a primary care provider one is provided in your discharge instructions.  Return to the emergency department for any new or worsening symptoms including but not limited to persistent fever for 5 days, difficulty breathing, chest pain, rashes, passing out, or any other concerns.   °

## 2018-11-23 ENCOUNTER — Ambulatory Visit: Payer: BLUE CROSS/BLUE SHIELD | Admitting: Obstetrics & Gynecology

## 2018-11-23 ENCOUNTER — Encounter: Payer: Self-pay | Admitting: Obstetrics & Gynecology

## 2018-11-23 VITALS — BP 126/88

## 2018-11-23 DIAGNOSIS — R87610 Atypical squamous cells of undetermined significance on cytologic smear of cervix (ASC-US): Secondary | ICD-10-CM | POA: Diagnosis not present

## 2018-11-23 DIAGNOSIS — Z113 Encounter for screening for infections with a predominantly sexual mode of transmission: Secondary | ICD-10-CM | POA: Diagnosis not present

## 2018-11-23 DIAGNOSIS — Z30011 Encounter for initial prescription of contraceptive pills: Secondary | ICD-10-CM

## 2018-11-23 DIAGNOSIS — Z1151 Encounter for screening for human papillomavirus (HPV): Secondary | ICD-10-CM

## 2018-11-23 DIAGNOSIS — N921 Excessive and frequent menstruation with irregular cycle: Secondary | ICD-10-CM

## 2018-11-23 DIAGNOSIS — Z975 Presence of (intrauterine) contraceptive device: Secondary | ICD-10-CM

## 2018-11-23 MED ORDER — NORETHIN ACE-ETH ESTRAD-FE 1-20 MG-MCG PO TABS: 1.0000 | ORAL_TABLET | Freq: Every day | ORAL | 4 refills | Status: DC

## 2018-11-23 NOTE — Progress Notes (Signed)
    Selena Murray 1996-01-04 [id number]        23 y.o.  G0 Single  RP: Unprotected IC 3 wks ago  HPI: Unprotected IC 3 wks ago, partner texted that he has Chlamydia.  Not sexually active since then.  No pelvic pain.  No abnormal vaginal discharge.  No fever.  On Nexplanon since 08/2016.  Frequent breakthrough bleeding since early December 2019.   OB History  Gravida Para Term Preterm AB Living  0            SAB TAB Ectopic Multiple Live Births               Past medical history,surgical history, problem list, medications, allergies, family history and social history were all reviewed and documented in the EPIC chart.   Directed ROS with pertinent positives and negatives documented in the history of present illness/assessment and plan.  Exam:  Vitals:   11/23/18 1428  BP: 126/88   General appearance:  Normal  Abdomen: Normal  Gynecologic exam: Vulva normal.  Speculum:  Cervix/Vagina normal.  No erythema.  Normal vaginal secretions.  Pap/HPV HR, Gono-Chlam done.   Assessment/Plan:  23 y.o. G0P0   1. Screen for STD (sexually transmitted disease) Strict condom use strongly recommended.  Full STD screening done today. - Pap/HPV HR  - Gono-Chlam on Pap - HIV antibody (with reflex) - RPR - Hepatitis C Antibody - Hepatitis B Surface AntiGEN  2. ASCUS of cervix with negative high risk HPV Pap/HPV HR done today  3. Breakthrough bleeding on Nexplanon Will f/u to remove the Nexplanon in 12/2018.  Started on birth control pills with Junel FE 1/20.  No contraindication to birth control pills.  Has used it in the past, feels that compliance will be better at this time.  Will start now to control breakthrough bleeding and then will continue on it for contraception after removing the Nexplanon.  4. Encounter for initial prescription of contraceptive pills  Started on birth control pills with Junel FE 1/20.  No contraindication to birth control pills.  Has used it in the  past with success.  Feels that compliance will not be an issue at this time.  Usage, risks and benefits reviewed with patient.  Prescription sent to pharmacy.  Will start the pills now.  Other orders - norethindrone-ethinyl estradiol (JUNEL FE,GILDESS FE,LOESTRIN FE) 1-20 MG-MCG tablet; Take 1 tablet by mouth daily.  Counseling on above issues and coordination of care more than 50% for 25 minutes.  Genia Del MD, 2:30 PM 11/23/2018

## 2018-11-23 NOTE — Addendum Note (Signed)
Addended by: Berna Spare A on: 11/23/2018 04:04 PM   Modules accepted: Orders

## 2018-11-23 NOTE — Patient Instructions (Signed)
1. Screen for STD (sexually transmitted disease) Strict condom use strongly recommended.  Full STD screening done today. - Pap/HPV HR  - Gono-Chlam on Pap - HIV antibody (with reflex) - RPR - Hepatitis C Antibody - Hepatitis B Surface AntiGEN  2. ASCUS of cervix with negative high risk HPV Pap/HPV HR done today  3. Breakthrough bleeding on Nexplanon Will f/u to remove the Nexplanon in 12/2018.  Started on birth control pills with Junel FE 1/20.  No contraindication to birth control pills.  Has used it in the past, feels that compliance will be better at this time.  Will start now to control breakthrough bleeding and then will continue on it for contraception after removing the Nexplanon.  4. Encounter for initial prescription of contraceptive pills  Started on birth control pills with Junel FE 1/20.  No contraindication to birth control pills.  Has used it in the past with success.  Feels that compliance will not be an issue at this time.  Usage, risks and benefits reviewed with patient.  Prescription sent to pharmacy.  Will start the pills now.  Other orders - norethindrone-ethinyl estradiol (JUNEL FE,GILDESS FE,LOESTRIN FE) 1-20 MG-MCG tablet; Take 1 tablet by mouth daily.  Selena Murray, it was a pleasure seeing you today!  I will inform you of your results as soon as they are available.

## 2018-11-24 LAB — HEPATITIS B SURFACE ANTIGEN: Hepatitis B Surface Ag: NONREACTIVE

## 2018-11-24 LAB — HEPATITIS C ANTIBODY
HEP C AB: NONREACTIVE
SIGNAL TO CUT-OFF: 0.03 (ref <1.00)

## 2018-11-24 LAB — RPR: RPR Ser Ql: NONREACTIVE

## 2018-11-24 LAB — HIV ANTIBODY (ROUTINE TESTING W REFLEX): HIV 1&2 Ab, 4th Generation: NONREACTIVE

## 2018-11-30 ENCOUNTER — Other Ambulatory Visit: Payer: Self-pay | Admitting: *Deleted

## 2018-11-30 LAB — PAP IG, CT-NG NAA, HPV HIGH-RISK
C. trachomatis RNA, TMA: DETECTED — AB
HPV DNA HIGH RISK: DETECTED — AB
N. gonorrhoeae RNA, TMA: NOT DETECTED

## 2018-11-30 LAB — HPV TYPE 16 AND 18/45 RNA
HPV Type 16 RNA: NOT DETECTED
HPV Type 18/45 RNA: NOT DETECTED

## 2018-11-30 MED ORDER — AZITHROMYCIN 500 MG PO TABS: ORAL_TABLET | ORAL | 0 refills | Status: DC

## 2018-12-23 ENCOUNTER — Ambulatory Visit: Payer: BLUE CROSS/BLUE SHIELD | Admitting: Obstetrics & Gynecology

## 2018-12-29 ENCOUNTER — Ambulatory Visit (INDEPENDENT_AMBULATORY_CARE_PROVIDER_SITE_OTHER): Payer: BLUE CROSS/BLUE SHIELD | Admitting: Obstetrics & Gynecology

## 2018-12-29 ENCOUNTER — Encounter: Payer: Self-pay | Admitting: Obstetrics & Gynecology

## 2018-12-29 ENCOUNTER — Other Ambulatory Visit: Payer: Self-pay

## 2018-12-29 VITALS — BP 124/86

## 2018-12-29 DIAGNOSIS — Z3041 Encounter for surveillance of contraceptive pills: Secondary | ICD-10-CM

## 2018-12-29 DIAGNOSIS — Z3046 Encounter for surveillance of implantable subdermal contraceptive: Secondary | ICD-10-CM

## 2018-12-29 NOTE — Progress Notes (Signed)
    Selena Murray [birth year] [id number]        23 y.o.  G0 Single  RP: Nexplanon removal.  HPI: Severe breakthrough bleeding on Nexplanon.  Nexplanon inserted November 2017.  Started on birth control pills with Janel FE 1/20 in February 2020.  Since then much improved with very little breakthrough bleeding.  No pelvic pain.  Normal vaginal secretions.   OB History  Gravida Para Term Preterm AB Living  0            SAB TAB Ectopic Multiple Live Births               Past medical history,surgical history, problem list, medications, allergies, family history and social history were all reviewed and documented in the EPIC chart.   Directed ROS with pertinent positives and negatives documented in the history of present illness/assessment and plan.  Exam:  Vitals:   12/29/18 1136  BP: 124/86   General appearance:  Normal                                                             Nexplanon procedure note (removal)  The patient presented to the office today requesting for removal of her Nexplanon that was placed in 08/2016  on her left arm.   On examination the nexplanon implant was palpated and the distal end  (end  closest to the elbow) was marked. The area was sterilized with Betadine solution. 1% lidocaine was used for local anesthesia and approximately 1 cc  was injected into the site that was marked where the incision was to be made. The local anesthetic was injected under the implant in an effort to keep it  close to the skin surface. Slight pressure pushing downward was made at the proximal end  of the implant in an effort to stabilize it. A bulge appeared indicating the distal end of the implant. A small transverse incision of 2 mm was made at that location. By gently pushing the implant toward the incision, the tip became visible. Grasping the implant with a curved forcep facilitated in gently removing the implant. Full confirmation of the entire implant which is 4 cm long  was inspected and was intact and was shown to the patient and discarded. After removing the implant, the incision was closed with 3Steri-Strips, a band-aid and a bandage. Patient will be instructed to remove the pressure bandage in 24 hours, the band-aid in 3 days and the Steri-Strips in 7 days.     Assessment/Plan:  23 y.o. G0P0   1. Encounter for Nexplanon removal Easy removal of Nexplanon.  No complication.  Precautions discussed with patient.  2. Encounter for surveillance of contraceptive pills Well on birth control pill with Junel FE 1/20.  No contraindication to continue.  F/U Annual Gynecologic visit.  Genia Del MD, 11:45 AM 12/29/2018

## 2018-12-29 NOTE — Patient Instructions (Signed)
1. Encounter for Nexplanon removal Easy removal of Nexplanon.  No complication.  Precautions discussed with patient.  2. Encounter for surveillance of contraceptive pills Well on birth control pill with Junel FE 1/20.  No contraindication to continue.  F/U Annual Gynecologic visit.  Zipora, it was a pleasure seeing you today!

## 2019-01-30 ENCOUNTER — Other Ambulatory Visit: Payer: Self-pay

## 2019-01-30 ENCOUNTER — Encounter (HOSPITAL_COMMUNITY): Payer: Self-pay

## 2019-01-30 ENCOUNTER — Emergency Department (HOSPITAL_COMMUNITY)
Admission: EM | Admit: 2019-01-30 | Discharge: 2019-01-30 | Disposition: A | Discharge status: Home / Self Care | Payer: BLUE CROSS/BLUE SHIELD | Attending: Emergency Medicine | Admitting: Emergency Medicine

## 2019-01-30 DIAGNOSIS — Z113 Encounter for screening for infections with a predominantly sexual mode of transmission: Secondary | ICD-10-CM | POA: Insufficient documentation

## 2019-01-30 DIAGNOSIS — A599 Trichomoniasis, unspecified: Secondary | ICD-10-CM | POA: Diagnosis not present

## 2019-01-30 DIAGNOSIS — Z7689 Persons encountering health services in other specified circumstances: Secondary | ICD-10-CM

## 2019-01-30 DIAGNOSIS — R21 Rash and other nonspecific skin eruption: Secondary | ICD-10-CM | POA: Diagnosis present

## 2019-01-30 LAB — RAPID HIV SCREEN (HIV 1/2 AB+AG)
HIV 1/2 Antibodies: NONREACTIVE
HIV-1 P24 Antigen - HIV24: NONREACTIVE

## 2019-01-30 LAB — URINALYSIS, ROUTINE W REFLEX MICROSCOPIC
Bilirubin Urine: NEGATIVE
Glucose, UA: NEGATIVE mg/dL
Ketones, ur: 15 mg/dL — AB
Nitrite: NEGATIVE
Protein, ur: NEGATIVE mg/dL
Specific Gravity, Urine: 1.025 (ref 1.005–1.030)
pH: 5 (ref 5.0–8.0)

## 2019-01-30 LAB — WET PREP, GENITAL
Clue Cells Wet Prep HPF POC: NONE SEEN
Sperm: NONE SEEN
Yeast Wet Prep HPF POC: NONE SEEN

## 2019-01-30 LAB — URINALYSIS, MICROSCOPIC (REFLEX)

## 2019-01-30 LAB — POC URINE PREG, ED: Preg Test, Ur: NEGATIVE

## 2019-01-30 MED ORDER — AZITHROMYCIN 250 MG PO TABS
1000.0000 mg | ORAL_TABLET | Freq: Once | ORAL | Status: AC
  Administered 2019-01-30: 1000 mg via ORAL
  Filled 2019-01-30: qty 4

## 2019-01-30 MED ORDER — CEFTRIAXONE SODIUM 250 MG IJ SOLR
250.0000 mg | Freq: Once | INTRAMUSCULAR | Status: AC
  Administered 2019-01-30: 09:00:00 250 mg via INTRAMUSCULAR
  Filled 2019-01-30: qty 250

## 2019-01-30 MED ORDER — METRONIDAZOLE 500 MG PO TABS
2000.0000 mg | ORAL_TABLET | Freq: Once | ORAL | Status: AC
  Administered 2019-01-30: 2000 mg via ORAL
  Filled 2019-01-30: qty 4

## 2019-01-30 MED ORDER — ONDANSETRON 4 MG PO TBDP
4.0000 mg | ORAL_TABLET | Freq: Once | ORAL | Status: AC
  Administered 2019-01-30: 4 mg via ORAL
  Filled 2019-01-30: qty 1

## 2019-01-30 NOTE — ED Provider Notes (Signed)
[facility]-EMERGENCY DEPT Provider Note   CSN: [id number] Arrival date & time: 01/30/19  0636    History   Chief Complaint Chief Complaint  Patient presents with  . Possible STD    HPI Selena Murray is a 23 y.o. female.     HPI   Pt is a 23 y/o female with a h/o ADHD, ovarian, who presents to the ED today due to concern for possible STD exposure. States had unprotected intercourse about 1 week ago and partner stated that he may have had exposure to STD from one of his prior partners.   Denies vaginal discharge, vaginal bleeding, fevers, abd pain, NV. Reports dysuria, but denies frequency, urgency, or hematuria. Also with rash to groin area after working long shifts this week. States she thinks sxs are due to chafing. Denies other complaints.   Past Medical History:  Diagnosis Date  . ADHD (attention deficit hyperactivity disorder)   . Ovarian cyst     Patient Active Problem List   Diagnosis Date Noted  . Nexplanon in place 07/02/2016    Past Surgical History:  Procedure Laterality Date  . TONSILLECTOMY    . WISDOM TOOTH EXTRACTION       OB History    Gravida  0   Para      Term      Preterm      AB      Living        SAB      TAB      Ectopic      Multiple      Live Births               Home Medications    Prior to Admission medications   Medication Sig Start Date End Date Taking? Authorizing Provider  norethindrone-ethinyl estradiol (JUNEL FE,GILDESS FE,LOESTRIN FE) 1-20 MG-MCG tablet Take 1 tablet by mouth daily. Patient not taking: Reported on 01/30/2019 11/23/18   Genia Del, MD    Family History Family History  Problem Relation Age of Onset  . Heart disease Maternal Grandmother   . Hyperlipidemia Maternal Grandmother   . Hypertension Maternal Grandmother     Social History Social History   Tobacco Use  . Smoking status: Never Smoker  . Smokeless tobacco: Never Used  Substance Use  Topics  . Alcohol use: Yes    Comment: Social  . Drug use: No     Allergies   Gold-containing drug products   Review of Systems Review of Systems  Constitutional: Negative for fever.  HENT: Negative for sore throat.   Eyes: Negative for visual disturbance.  Respiratory: Negative for cough and shortness of breath.   Cardiovascular: Negative for chest pain.  Gastrointestinal: Negative for abdominal pain, constipation, diarrhea, nausea and vomiting.  Genitourinary: Positive for dysuria. Negative for frequency, hematuria, pelvic pain, urgency, vaginal bleeding and vaginal discharge.  Musculoskeletal: Negative for back pain.  Skin: Negative for color change and rash.  Neurological: Negative for headaches.  All other systems reviewed and are negative.   Physical Exam Updated Vital Signs BP 117/88 (BP Location: Right Arm)   Pulse 92   Temp (!) 97.5 F (36.4 C) (Oral)   Resp 15   Ht 5' 9.75" (1.772 m)   Wt 97.5 kg   SpO2 100%   BMI 31.07 kg/m   Physical Exam Vitals signs and nursing note reviewed.  Constitutional:      General: She is not in acute distress.  Appearance: She is well-developed. She is not ill-appearing or toxic-appearing.  HENT:     Head: Normocephalic and atraumatic.  Eyes:     Conjunctiva/sclera: Conjunctivae normal.  Neck:     Musculoskeletal: Neck supple.  Cardiovascular:     Rate and Rhythm: Regular rhythm. Tachycardia present.     Heart sounds: No murmur.  Pulmonary:     Effort: Pulmonary effort is normal. No respiratory distress.     Breath sounds: Normal breath sounds.  Abdominal:     General: Bowel sounds are normal.     Palpations: Abdomen is soft.     Tenderness: There is no abdominal tenderness. There is no guarding or rebound.  Genitourinary:    Comments: Exam performed by Karrie Meresortni S Emiyah Spraggins,  exam chaperoned Date: 01/30/2019 Pelvic exam: normal external genitalia without evidence of trauma. VULVA: normal appearing vulva with no  masses, tenderness or lesion. VAGINA: normal appearing vagina with normal color, no lesions. White discharge present. CERVIX: normal appearing cervix without lesions, cervical motion tenderness absent, cervical os closed with out purulent discharge; vaginal discharge - white discharge present, Wet prep and DNA probe for chlamydia and GC obtained.   ADNEXA: normal adnexa in size, nontender and no masses UTERUS: uterus is normal size, shape, consistency and nontender.  Skin:    General: Skin is warm and dry.     Comments: Skin to bilat upper thighs is erythematous. No lesions or vesicles present. No evidence of infection. Appearance consistent with irritation due to friction.   Neurological:     Mental Status: She is alert.     Coordination: Coordination normal.  Psychiatric:     Comments: anxious     ED Treatments / Results  Labs (all labs ordered are listed, but only abnormal results are displayed) Labs Reviewed  WET PREP, GENITAL - Abnormal; Notable for the following components:      Result Value   Trich, Wet Prep PRESENT (*)    WBC, Wet Prep HPF POC MANY (*)    All other components within normal limits  URINALYSIS, ROUTINE W REFLEX MICROSCOPIC - Abnormal; Notable for the following components:   APPearance HAZY (*)    Hgb urine dipstick TRACE (*)    Ketones, ur 15 (*)    Leukocytes,Ua SMALL (*)    All other components within normal limits  URINALYSIS, MICROSCOPIC (REFLEX) - Abnormal; Notable for the following components:   Bacteria, UA RARE (*)    All other components within normal limits  RAPID HIV SCREEN (HIV 1/2 AB+AG)  RPR  POC URINE PREG, ED  GC/CHLAMYDIA PROBE AMP ([place]    EKG None  Radiology No results found.  Procedures Procedures (including critical care time)  Medications Ordered in ED Medications  cefTRIAXone (ROCEPHIN) injection 250 mg (250 mg Intramuscular Given 01/30/19 0833)  azithromycin (ZITHROMAX) tablet 1,000 mg (1,000 mg Oral  Given 01/30/19 0831)  metroNIDAZOLE (FLAGYL) tablet 2,000 mg (2,000 mg Oral Given 01/30/19 0838)  ondansetron (ZOFRAN-ODT) disintegrating tablet 4 mg (4 mg Oral Given 01/30/19 0837)     Initial Impression / Assessment and Plan / ED Course  I have reviewed the triage vital signs and the nursing notes.  Pertinent labs & imaging results that were available during my care of the patient were reviewed by me and considered in my medical decision making (see chart for details).   Final Clinical Impressions(s) / ED Diagnoses   Final diagnoses:  Encounter for assessment of STD exposure  Trichomoniasis  Patient to be discharged with instructions to follow up with pcp. Discussed importance of using protection when sexually active. Pt understands that they have GC/Chlamydia cultures pending and that they will need to inform all sexual partners if results return positive. HIV and RPR sent. UA is not suggestive of UTI. Preg test negative. Pt has been treated prophylacticly with azithromycin and rocephin due to pts history, pelvic exam, and wet prep with increased WBCs and trich, tx with flagyl. Pt not concerning for PID because hemodynamically stable and no cervical motion tenderness on pelvic exam. Pt has been advised to not drink alcohol while on this medication.    ED Discharge Orders    None       Rayne Du 01/30/19 2549    Shaune Pollack, MD 01/31/19 [phone]

## 2019-01-30 NOTE — ED Notes (Signed)
Patient made aware that urine sample is needed.  

## 2019-01-30 NOTE — ED Notes (Signed)
Pelvic Exam is currently being conducted.

## 2019-01-30 NOTE — Discharge Instructions (Signed)

## 2019-01-30 NOTE — ED Triage Notes (Addendum)
Patient states that she had unprotected sex with a female partner on Monday that believes he may have an STD. Patient denies discharge or foul odor but does have burning with urination that started yesterday. She also complains of a rash or chafing between her legs that she noticed about three days ago. She states that she wants to get it checked out to be safe.

## 2019-01-31 LAB — RPR: RPR Ser Ql: NONREACTIVE

## 2019-02-01 LAB — GC/CHLAMYDIA PROBE AMP (~~LOC~~) NOT AT ARMC
Chlamydia: NEGATIVE
Neisseria Gonorrhea: NEGATIVE

## 2019-05-10 ENCOUNTER — Ambulatory Visit: Payer: Self-pay

## 2019-05-13 ENCOUNTER — Ambulatory Visit: Payer: Self-pay | Admitting: Physician Assistant

## 2019-05-13 ENCOUNTER — Other Ambulatory Visit: Payer: Self-pay

## 2019-05-13 ENCOUNTER — Encounter: Payer: Self-pay | Admitting: Physician Assistant

## 2019-05-13 DIAGNOSIS — Z3A08 8 weeks gestation of pregnancy: Secondary | ICD-10-CM

## 2019-05-13 DIAGNOSIS — Z113 Encounter for screening for infections with a predominantly sexual mode of transmission: Secondary | ICD-10-CM

## 2019-05-13 LAB — WET PREP FOR TRICH, YEAST, CLUE
Trichomonas Exam: NEGATIVE
Yeast Exam: NEGATIVE

## 2019-05-13 NOTE — Progress Notes (Signed)
STI clinic/screening visit  Subjective:  Selena Murray is a 23 y.o. female being seen today for an STI screening visit. The patient reports they do not have symptoms.  Patient has the following medical conditions:   Patient Active Problem List   Diagnosis Date Noted  . Nexplanon in place 07/02/2016     Chief Complaint  Patient presents with  . SEXUALLY TRANSMITTED DISEASE    HPI  Patient reports that she would like a screening today. States that she just found out that she is [redacted] weeks EGA, but has not scheduled for NOB visit anywhere yet.  States that she plans to go to a practice in [place].  Reports that she changed jobs and her insurance has not come into effect yet and wants to wait for that before making an appointment for [facility].   See flowsheet for further details and programmatic requirements.    The following portions of the patient's history were reviewed and updated as appropriate: allergies, current medications, past medical history, past social history, past surgical history and problem list.  Objective:  There were no vitals filed for this visit.  Physical Exam Constitutional:      General: She is not in acute distress.    Appearance: Normal appearance.  HENT:     Head: Normocephalic and atraumatic.     Mouth/Throat:     Mouth: Mucous membranes are moist.     Pharynx: Oropharynx is clear. No oropharyngeal exudate or posterior oropharyngeal erythema.  Neck:     Musculoskeletal: Neck supple.  Pulmonary:     Effort: Pulmonary effort is normal.  Abdominal:     Palpations: There is no mass.     Tenderness: There is no abdominal tenderness. There is no guarding or rebound.  Genitourinary:    Rectum: Normal.     Comments: External genitalia/pubic area without nits, lice, edema, erythema, lesions and inguinal adenopathy. Vagina with normal mucosa and discharge. Cervix without visible lesions. Uterus firm mobile, nt, no masses, no CMT, no adnexal  tenderness or fullness.  Lymphadenopathy:     Cervical: No cervical adenopathy.  Skin:    General: Skin is warm and dry.     Findings: No bruising, erythema, lesion or rash.     Comments: tattoos  Neurological:     Mental Status: She is alert and oriented to person, place, and time.  Psychiatric:        Mood and Affect: Mood normal.        Behavior: Behavior normal.        Thought Content: Thought content normal.        Judgment: Judgment normal.       Assessment and Plan:  Selena Murray is a 23 y.o. female presenting to the [facility] Department for STI screening  1. Screening for STD (sexually transmitted disease) Patient is without symptoms today. Rec condoms with all sex Await test results.  Counseled that RN will call if needs to RTC for any treatment after results are back.  - WET PREP FOR TRICH, YEAST, CLUE - Gonococcus culture - Chlamydia/Gonorrhea [place] - HIV [place] [facility] - Syphilis Serology, [facility]  2. [redacted] weeks gestation of pregnancy Counseled patient to apply for MPW ASAP and schedule for NOB visit. Counseled that she can have both her insurance from work and medicaid to help pay for the pregnancy Counseled that she should not delay starting prenatal care and patient agrees with plan.  No follow-ups on file.  Future Appointments  Date Time Provider Department Center  06/22/2019  8:30 AM Maple HudsonYoung, Davonna BellingNancy J, NP [facility]    Matt Holmesarla J Alexxa Sabet, [place]

## 2019-05-18 LAB — GONOCOCCUS CULTURE

## 2019-05-20 ENCOUNTER — Other Ambulatory Visit: Payer: Self-pay

## 2019-05-20 ENCOUNTER — Telehealth: Payer: Self-pay

## 2019-05-20 ENCOUNTER — Ambulatory Visit: Payer: Self-pay

## 2019-05-20 DIAGNOSIS — A749 Chlamydial infection, unspecified: Secondary | ICD-10-CM

## 2019-05-20 MED ORDER — AZITHROMYCIN 500 MG PO TABS
1000.0000 mg | ORAL_TABLET | Freq: Once | ORAL | Status: AC
  Administered 2019-05-20: 19:00:00 1000 mg via ORAL

## 2019-05-20 NOTE — Progress Notes (Signed)
Tx'd for chlaymdia per SO. Tolerated well Aileen Fass, RN

## 2019-05-20 NOTE — Telephone Encounter (Signed)
TC to patient. Verified ID via password/SS#. Informed of positive chlamydia and need for tx. Instructed to eat before visit and have partner call for tx appt. Appt scheduled.Theola Cuellar, RN    

## 2019-06-18 ENCOUNTER — Other Ambulatory Visit: Payer: Self-pay

## 2019-06-22 ENCOUNTER — Other Ambulatory Visit: Payer: Self-pay

## 2019-06-22 ENCOUNTER — Encounter: Payer: Self-pay | Admitting: Women's Health

## 2019-06-22 ENCOUNTER — Ambulatory Visit (INDEPENDENT_AMBULATORY_CARE_PROVIDER_SITE_OTHER): Payer: BC Managed Care – PPO | Admitting: Women's Health

## 2019-06-22 VITALS — BP 118/80 | Ht 69.0 in | Wt 206.0 lb

## 2019-06-22 DIAGNOSIS — N760 Acute vaginitis: Secondary | ICD-10-CM

## 2019-06-22 DIAGNOSIS — Z113 Encounter for screening for infections with a predominantly sexual mode of transmission: Secondary | ICD-10-CM

## 2019-06-22 LAB — WET PREP FOR TRICH, YEAST, CLUE

## 2019-06-22 NOTE — Progress Notes (Signed)
23 year old S WF presents for test of cure chlamydia.  08/2016 Nexplanon was removed 12/2018, LMP 03/22/2019 and had an viability ultrasound 05/05/2019 that found her to be 6 weeks 3 days pregnant.  Was contemplating termination but has decided to continue with pregnancy.  Denies vaginal discharge, urinary symptoms, abdominal pain, spotting, or fever.  11/2018+ chlamydia had negative test to cure 01/2019.  05/13/2019 at health department was noted to be positive chlamydia and will check test of cure today.  Had negative HIV and syphilis.  States has abstained since then no longer with past partner, denies physical abuse.  11/2018+ trichomonas.  Gardasil series completed.  Parents healthy and supportive.  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  Works for [employer] and requesting note to state not to lift greater than 25 pounds and be allowed bathroom breaks as needed.  Has had some nausea and occasional vomiting.  Exam: Appears well, happy with pregnancy.  No CVAT.  Abdomen soft, nontender, external genitalia within normal limits, speculum exam moderate amount of a white discharge, wet prep positive for clues, TNTC bacteria, GC/chlamydia culture taken.  Bimanual uterus 10-12week size, no CMT or adnexal tenderness.  [redacted] weeks gestation per dates/viable 6-week ultrasound Test to cure chlamydia Bacterial vaginosis  Plan: Flagyl 500 twice daily for 7 days.  Encouraged to continue abstinence from past partner and condoms encouraged to become sexually active.  Safe pregnancy behaviors reviewed, prenatal vitamin daily, start prenatal care ASAP.  Note for work to avoid lifting greater than 25 pounds and frequent bathroom breaks given.  Reviewed if she needs FMLA will need to get that through OB.

## 2019-06-22 NOTE — Patient Instructions (Addendum)
Selena Murray is pregnant and should NOT lift anything heavier than 25 pounds and please allow hourly 5 min bathroom breaks if needed.    Selena RowanNancy Jenicka Murray [facility]   First Trimester of Pregnancy  The first trimester of pregnancy is from week 1 until the end of week 13 (months 1 through 3). During this time, your baby will begin to develop inside you. At 6-8 weeks, the eyes and face are formed, and the heartbeat can be seen on ultrasound. At the end of 12 weeks, all the baby's organs are formed. Prenatal care is all the medical care you receive before the birth of your baby. Make sure you get good prenatal care and follow all of your doctor's instructions. Follow these instructions at home: Medicines  Take over-the-counter and prescription medicines only as told by your doctor. Some medicines are safe and some medicines are not safe during pregnancy.  Take a prenatal vitamin that contains at least 600 micrograms (mcg) of folic acid.  If you have trouble pooping (constipation), take medicine that will make your stool soft (stool softener) if your doctor approves. Eating and drinking   Eat regular, healthy meals.  Your doctor will tell you the amount of weight gain that is right for you.  Avoid raw meat and uncooked cheese.  If you feel sick to your stomach (nauseous) or throw up (vomit): ? Eat 4 or 5 small meals a day instead of 3 large meals. ? Try eating a few soda crackers. ? Drink liquids between meals instead of during meals.  To prevent constipation: ? Eat foods that are high in fiber, like fresh fruits and vegetables, whole grains, and beans. ? Drink enough fluids to keep your pee (urine) clear or pale yellow. Activity  Exercise only as told by your doctor. Stop exercising if you have cramps or pain in your lower belly (abdomen) or low back.  Do not exercise if it is too hot, too humid, or if you are in a place of great height (high altitude).  Try to avoid standing for long periods of  time. Move your legs often if you must stand in one place for a long time.  Avoid heavy lifting.  Wear low-heeled shoes. Sit and stand up straight.  You can have sex unless your doctor tells you not to. Relieving pain and discomfort  Wear a good support bra if your breasts are sore.  Take warm water baths (sitz baths) to soothe pain or discomfort caused by hemorrhoids. Use hemorrhoid cream if your doctor says it is okay.  Rest with your legs raised if you have leg cramps or low back pain.  If you have puffy, bulging veins (varicose veins) in your legs: ? Wear support hose or compression stockings as told by your doctor. ? Raise (elevate) your feet for 15 minutes, 3-4 times a day. ? Limit salt in your food. Prenatal care  Schedule your prenatal visits by the twelfth week of pregnancy.  Write down your questions. Take them to your prenatal visits.  Keep all your prenatal visits as told by your doctor. This is important. Safety  Wear your seat belt at all times when driving.  Make a list of emergency phone numbers. The list should include numbers for family, friends, the hospital, and police and fire departments. General instructions  Ask your doctor for a referral to a local prenatal class. Begin classes no later than at the start of month 6 of your pregnancy.  Ask for help if you  need counseling or if you need help with nutrition. Your doctor can give you advice or tell you where to go for help.  Do not use hot tubs, steam rooms, or saunas.  Do not douche or use tampons or scented sanitary pads.  Do not cross your legs for long periods of time.  Avoid all herbs and alcohol. Avoid drugs that are not approved by your doctor.  Do not use any tobacco products, including cigarettes, chewing tobacco, and electronic cigarettes. If you need help quitting, ask your doctor. You may get counseling or other support to help you quit.  Avoid cat litter boxes and soil used by cats.  These carry germs that can cause birth defects in the baby and can cause a loss of your baby (miscarriage) or stillbirth.  Visit your dentist. At home, brush your teeth with a soft toothbrush. Be gentle when you floss. Contact a doctor if:  You are dizzy.  You have mild cramps or pressure in your lower belly.  You have a nagging pain in your belly area.  You continue to feel sick to your stomach, you throw up, or you have watery poop (diarrhea).  You have a bad smelling fluid coming from your vagina.  You have pain when you pee (urinate).  You have increased puffiness (swelling) in your face, hands, legs, or ankles. Get help right away if:  You have a fever.  You are leaking fluid from your vagina.  You have spotting or bleeding from your vagina.  You have very bad belly cramping or pain.  You gain or lose weight rapidly.  You throw up blood. It may look like coffee grounds.  You are around people who have [place] measles, fifth disease, or chickenpox.  You have a very bad headache.  You have shortness of breath.  You have any kind of trauma, such as from a fall or a car accident. Summary  The first trimester of pregnancy is from week 1 until the end of week 13 (months 1 through 3).  To take care of yourself and your unborn baby, you will need to eat healthy meals, take medicines only if your doctor tells you to do so, and do activities that are safe for you and your baby.  Keep all follow-up visits as told by your doctor. This is important as your doctor will have to ensure that your baby is healthy and growing well. This information is not intended to replace advice given to you by your health care provider. Make sure you discuss any questions you have with your health care provider. Document Released: 03/18/2008 Document Revised: 01/21/2019 Document Reviewed: 10/08/2016 Elsevier Patient Education  2020 [employer]. y [facility]

## 2019-06-23 ENCOUNTER — Telehealth: Payer: Self-pay

## 2019-06-23 ENCOUNTER — Other Ambulatory Visit: Payer: Self-pay

## 2019-06-23 LAB — C. TRACHOMATIS/N. GONORRHOEAE RNA
C. trachomatis RNA, TMA: NOT DETECTED
N. gonorrhoeae RNA, TMA: NOT DETECTED

## 2019-06-23 MED ORDER — METRONIDAZOLE 500 MG PO TABS: 500.0000 mg | ORAL_TABLET | Freq: Two times a day (BID) | ORAL | 0 refills | Status: DC

## 2019-06-23 NOTE — Telephone Encounter (Signed)
Patient was seen yesterday morning and pharmacy does not have Rx [place] was going to send. [place]'s note states Flagyl 500 bid x 7d. Does note appear it was sent. Apologies to patient and Rx sent.

## 2019-06-28 DIAGNOSIS — R638 Other symptoms and signs concerning food and fluid intake: Secondary | ICD-10-CM | POA: Insufficient documentation

## 2019-06-28 DIAGNOSIS — Z3490 Encounter for supervision of normal pregnancy, unspecified, unspecified trimester: Secondary | ICD-10-CM | POA: Insufficient documentation

## 2019-06-28 LAB — OB RESULTS CONSOLE HIV ANTIBODY (ROUTINE TESTING): HIV: NONREACTIVE

## 2019-06-28 LAB — OB RESULTS CONSOLE ANTIBODY SCREEN: Antibody Screen: NEGATIVE

## 2019-06-28 LAB — OB RESULTS CONSOLE HEPATITIS B SURFACE ANTIGEN: Hepatitis B Surface Ag: NEGATIVE

## 2019-06-28 LAB — OB RESULTS CONSOLE RPR: RPR: NONREACTIVE

## 2019-06-28 LAB — OB RESULTS CONSOLE ABO/RH: RH Type: NEGATIVE

## 2019-06-28 LAB — OB RESULTS CONSOLE GC/CHLAMYDIA
Chlamydia: NEGATIVE
Gonorrhea: NEGATIVE

## 2019-06-28 LAB — OB RESULTS CONSOLE RUBELLA ANTIBODY, IGM: Rubella: IMMUNE

## 2019-07-02 DIAGNOSIS — R87612 Low grade squamous intraepithelial lesion on cytologic smear of cervix (LGSIL): Secondary | ICD-10-CM | POA: Insufficient documentation

## 2019-10-15 NOTE — L&D Delivery Note (Signed)
Delivery Note Patient pushed well for 17 minutes.  At 12:37 PM a viable female was delivered via Vaginal, Spontaneous (Presentation: Right Occiput Anterior).  APGAR: 9, 9; weight 3830 gm (8lb 7.1oz) .   Placenta status: Spontaneous, Intact.  Cord: 3 vessels with the following complications: None.  Cord pH: n/a  Anesthesia: Epidural Episiotomy: None Lacerations: 2nd degree Suture Repair: 2.0 vicryl rapide Est. Blood Loss (mL): 250  Mom to postpartum.  Baby to Couplet care / Skin to Skin.  Selena Murray GEFFEL Chayce Rullo 12/30/2019, 1:20 PM

## 2019-12-01 LAB — OB RESULTS CONSOLE GBS: GBS: NEGATIVE

## 2019-12-23 ENCOUNTER — Other Ambulatory Visit: Payer: Self-pay | Admitting: Obstetrics and Gynecology

## 2019-12-27 ENCOUNTER — Telehealth (HOSPITAL_COMMUNITY): Payer: Self-pay | Admitting: *Deleted

## 2019-12-27 ENCOUNTER — Encounter (HOSPITAL_COMMUNITY): Payer: Self-pay | Admitting: *Deleted

## 2019-12-27 NOTE — Telephone Encounter (Signed)
Preadmission screen  

## 2019-12-28 ENCOUNTER — Inpatient Hospital Stay (EMERGENCY_DEPARTMENT_HOSPITAL)
Admission: AD | Admit: 2019-12-28 | Discharge: 2019-12-28 | Disposition: A | Discharge status: Home / Self Care | Payer: 59 | Source: Home / Self Care | Attending: Obstetrics and Gynecology | Admitting: Obstetrics and Gynecology

## 2019-12-28 ENCOUNTER — Encounter (HOSPITAL_COMMUNITY): Payer: Self-pay | Admitting: Obstetrics and Gynecology

## 2019-12-28 DIAGNOSIS — Z3A40 40 weeks gestation of pregnancy: Secondary | ICD-10-CM

## 2019-12-28 DIAGNOSIS — O368130 Decreased fetal movements, third trimester, not applicable or unspecified: Secondary | ICD-10-CM | POA: Insufficient documentation

## 2019-12-28 DIAGNOSIS — O368131 Decreased fetal movements, third trimester, fetus 1: Secondary | ICD-10-CM

## 2019-12-28 DIAGNOSIS — Z3689 Encounter for other specified antenatal screening: Secondary | ICD-10-CM

## 2019-12-28 NOTE — Discharge Instructions (Signed)
Fetal Movement Counts Patient Name: ________________________________________________ Patient Due Date: ____________________ What is a fetal movement count?  A fetal movement count is the number of times that you feel your baby move during a certain amount of time. This may also be called a fetal kick count. A fetal movement count is recommended for every pregnant woman. You may be asked to start counting fetal movements as early as week 28 of your pregnancy. Pay attention to when your baby is most active. You may notice your baby's sleep and wake cycles. You may also notice things that make your baby move more. You should do a fetal movement count:  When your baby is normally most active.  At the same time each day. A good time to count movements is while you are resting, after having something to eat and drink. How do I count fetal movements? 1. Find a quiet, comfortable area. Sit, or lie down on your side. 2. Write down the date, the start time and stop time, and the number of movements that you felt between those two times. Take this information with you to your health care visits. 3. Write down your start time when you feel the first movement. 4. Count kicks, flutters, swishes, rolls, and jabs. You should feel at least 10 movements. 5. You may stop counting after you have felt 10 movements, or if you have been counting for 2 hours. Write down the stop time. 6. If you do not feel 10 movements in 2 hours, contact your health care provider for further instructions. Your health care provider may want to do additional tests to assess your baby's well-being. Contact a health care provider if:  You feel fewer than 10 movements in 2 hours.  Your baby is not moving like he or she usually does. Date: ____________ Start time: ____________ Stop time: ____________ Movements: ____________ Date: ____________ Start time: ____________ Stop time: ____________ Movements: ____________ Date: ____________  Start time: ____________ Stop time: ____________ Movements: ____________ Date: ____________ Start time: ____________ Stop time: ____________ Movements: ____________ Date: ____________ Start time: ____________ Stop time: ____________ Movements: ____________ Date: ____________ Start time: ____________ Stop time: ____________ Movements: ____________ Date: ____________ Start time: ____________ Stop time: ____________ Movements: ____________ Date: ____________ Start time: ____________ Stop time: ____________ Movements: ____________ Date: ____________ Start time: ____________ Stop time: ____________ Movements: ____________ This information is not intended to replace advice given to you by your health care provider. Make sure you discuss any questions you have with your health care provider. Document Revised: 05/20/2019 Document Reviewed: 05/20/2019 Elsevier Patient Education  2020 Elsevier Inc[employer]  

## 2019-12-28 NOTE — MAU Note (Signed)
Pt is reporting decreased today. Has tried eating and drinking but did not feel as much movement. Denies LOF or VB. Lost mucous plug.

## 2019-12-28 NOTE — MAU Provider Note (Signed)
History   [id number]   Chief Complaint  Patient presents with  . Decreased Fetal Movement    HPI Selena Murray is a 24 y.o. female  G1P0000 here with report of decreased fetal movement since earlier this morning. Still feeling movement but not as much & can't quantify. Denies vaginal bleeding or leaking of fluid. Denies contractions.  Patient's last menstrual period was 03/22/2019.  OB History  Gravida Para Term Preterm AB Living  1 0 0 0 0 0  SAB TAB Ectopic Multiple Live Births  0 0 0 0 0    # Outcome Date GA Lbr Len/2nd Weight Sex Delivery Anes PTL Lv  1 Current             Past Medical History:  Diagnosis Date  . ADHD (attention deficit hyperactivity disorder)   . Ovarian cyst     Family History  Problem Relation Age of Onset  . Heart disease Maternal Grandmother   . Hyperlipidemia Maternal Grandmother   . Hypertension Maternal Grandmother     Social History   Socioeconomic History  . Marital status: Single    Spouse name: Not on file  . Number of children: Not on file  . Years of education: Not on file  . Highest education level: Not on file  Occupational History  . Not on file  Tobacco Use  . Smoking status: Never Smoker  . Smokeless tobacco: Never Used  Substance and Sexual Activity  . Alcohol use: Not Currently  . Drug use: No  . Sexual activity: Not Currently    Birth control/protection: Condom    Comment: 1st intercourse 24 yo-More than 5 partners  Other Topics Concern  . Not on file  Social History Narrative  . Not on file   Social Determinants of Health   Financial Resource Strain:   . Difficulty of Paying Living Expenses:   Food Insecurity:   . Worried About Programme researcher, broadcasting/film/video in the Last Year:   . Barista in the Last Year:   Transportation Needs:   . Freight forwarder (Medical):   Marland Kitchen Lack of Transportation (Non-Medical):   Physical Activity:   . Days of Exercise per Week:   . Minutes of Exercise per Session:    Stress:   . Feeling of Stress :   Social Connections:   . Frequency of Communication with Friends and Family:   . Frequency of Social Gatherings with Friends and Family:   . Attends Religious Services:   . Active Member of Clubs or Organizations:   . Attends Banker Meetings:   Marland Kitchen Marital Status:     Allergies  Allergen Reactions  . Gold-Containing Drug Products     No current facility-administered medications on file prior to encounter.   Current Outpatient Medications on File Prior to Encounter  Medication Sig Dispense Refill  . Prenatal Vit-Fe Fumarate-FA (MULTIVITAMIN-PRENATAL) 27-0.8 MG TABS tablet Take 1 tablet by mouth daily at 12 noon.    . metroNIDAZOLE (FLAGYL) 500 MG tablet Take 1 tablet (500 mg total) by mouth 2 (two) times daily. 14 tablet 0     Review of Systems  Constitutional: Negative.   Gastrointestinal: Negative.   Genitourinary: Negative.      Physical Exam   Vitals:   12/28/19 1810 12/28/19 1900  BP: 135/71 131/81  Pulse: (!) 103   Resp: 18   SpO2: 98%   Weight: 108.9 kg   Height: 5' 9.75" (1.772 m)  Physical Exam  Nursing note and vitals reviewed. Constitutional: She appears well-developed and well-nourished. No distress.  Respiratory: Effort normal. No respiratory distress.  GI: Soft. There is no abdominal tenderness.  Skin: Skin is warm and dry. She is not diaphoretic.  Psychiatric: She has a normal mood and affect. Her behavior is normal. Judgment and thought content normal.   NST:  Baseline: 135 bpm, Variability: Good {> 6 bpm), Accelerations: Reactive and Decelerations: Absent  MAU Course  Procedures  MDM Reactive NST -- short time of maternal heart rate tracing when patient leaned up to get her phone, otherwise looks good.   Reports return of good fetal movement. Reports 10+ movements in 30 minutes.   Dr. Ouida Sills notified of patient. He agrees with plan to discharge home with kick counts. She has an  appointment in the office on Thursday.   Assessment and Plan  A: 1. Decreased fetal movements in third trimester, single or unspecified fetus   2. [redacted] weeks gestation of pregnancy   3. NST (non-stress test) reactive    P: Discharge home Fetal kick counts Keep f/u in office Labor precautions   Jorje Guild, NP 12/28/2019 6:34 PM

## 2019-12-30 ENCOUNTER — Inpatient Hospital Stay (HOSPITAL_COMMUNITY): Payer: 59 | Admitting: Anesthesiology

## 2019-12-30 ENCOUNTER — Other Ambulatory Visit: Payer: Self-pay

## 2019-12-30 ENCOUNTER — Encounter (HOSPITAL_COMMUNITY): Payer: Self-pay | Admitting: Obstetrics & Gynecology

## 2019-12-30 ENCOUNTER — Inpatient Hospital Stay (HOSPITAL_COMMUNITY)
Admission: AD | Admit: 2019-12-30 | Discharge: 2020-01-01 | DRG: 807 | Disposition: A | Discharge status: Home / Self Care | Payer: 59 | Attending: Obstetrics | Admitting: Obstetrics

## 2019-12-30 DIAGNOSIS — Z20822 Contact with and (suspected) exposure to covid-19: Secondary | ICD-10-CM | POA: Diagnosis present

## 2019-12-30 DIAGNOSIS — Z3A40 40 weeks gestation of pregnancy: Secondary | ICD-10-CM | POA: Diagnosis not present

## 2019-12-30 DIAGNOSIS — O4290 Premature rupture of membranes, unspecified as to length of time between rupture and onset of labor, unspecified weeks of gestation: Secondary | ICD-10-CM | POA: Diagnosis present

## 2019-12-30 DIAGNOSIS — O26893 Other specified pregnancy related conditions, third trimester: Secondary | ICD-10-CM | POA: Diagnosis present

## 2019-12-30 DIAGNOSIS — Z6791 Unspecified blood type, Rh negative: Secondary | ICD-10-CM

## 2019-12-30 LAB — CBC
HCT: 37.5 % (ref 36.0–46.0)
Hemoglobin: 12.1 g/dL (ref 12.0–15.0)
MCH: 29.8 pg (ref 26.0–34.0)
MCHC: 32.3 g/dL (ref 30.0–36.0)
MCV: 92.4 fL (ref 80.0–100.0)
Platelets: 305 10*3/uL (ref 150–400)
RBC: 4.06 MIL/uL (ref 3.87–5.11)
RDW: 13.9 % (ref 11.5–15.5)
WBC: 12.4 10*3/uL — ABNORMAL HIGH (ref 4.0–10.5)
nRBC: 0 % (ref 0.0–0.2)

## 2019-12-30 LAB — TYPE AND SCREEN
ABO/RH(D): O NEG
Antibody Screen: NEGATIVE

## 2019-12-30 LAB — ABO/RH: ABO/RH(D): O NEG

## 2019-12-30 LAB — SARS CORONAVIRUS 2 (TAT 6-24 HRS): SARS Coronavirus 2: NEGATIVE

## 2019-12-30 LAB — POCT FERN TEST: POCT Fern Test: POSITIVE

## 2019-12-30 LAB — RPR: RPR Ser Ql: NONREACTIVE

## 2019-12-30 MED ORDER — LACTATED RINGERS IV SOLN: 500.0000 mL | INTRAVENOUS | Status: DC | PRN

## 2019-12-30 MED ORDER — DIPHENHYDRAMINE HCL 25 MG PO CAPS: 25.0000 mg | ORAL_CAPSULE | Freq: Four times a day (QID) | ORAL | Status: DC | PRN

## 2019-12-30 MED ORDER — PHENYLEPHRINE 40 MCG/ML (10ML) SYRINGE FOR IV PUSH (FOR BLOOD PRESSURE SUPPORT): 80.0000 ug | PREFILLED_SYRINGE | INTRAVENOUS | Status: DC | PRN

## 2019-12-30 MED ORDER — TETANUS-DIPHTH-ACELL PERTUSSIS 5-2.5-18.5 LF-MCG/0.5 IM SUSP: 0.5000 mL | Freq: Once | INTRAMUSCULAR | Status: DC

## 2019-12-30 MED ORDER — LACTATED RINGERS IV SOLN
500.0000 mL | Freq: Once | INTRAVENOUS | Status: AC
  Administered 2019-12-30: 10:00:00 1000 mL via INTRAVENOUS

## 2019-12-30 MED ORDER — OXYTOCIN 40 UNITS IN NORMAL SALINE INFUSION - SIMPLE MED: 1.0000 m[IU]/min | INTRAVENOUS | Status: DC

## 2019-12-30 MED ORDER — COCONUT OIL OIL: 1.0000 "application " | TOPICAL_OIL | Status: DC | PRN

## 2019-12-30 MED ORDER — TERBUTALINE SULFATE 1 MG/ML IJ SOLN: 0.2500 mg | Freq: Once | INTRAMUSCULAR | Status: DC | PRN

## 2019-12-30 MED ORDER — EPHEDRINE 5 MG/ML INJ: 10.0000 mg | INTRAVENOUS | Status: DC | PRN

## 2019-12-30 MED ORDER — FENTANYL-BUPIVACAINE-NACL 0.5-0.125-0.9 MG/250ML-% EP SOLN: 12.0000 mL/h | EPIDURAL | Status: DC | PRN

## 2019-12-30 MED ORDER — SODIUM CHLORIDE (PF) 0.9 % IJ SOLN
INTRAMUSCULAR | Status: DC | PRN
  Administered 2019-12-30: 12 mL/h via EPIDURAL

## 2019-12-30 MED ORDER — LACTATED RINGERS IV SOLN: INTRAVENOUS | Status: DC

## 2019-12-30 MED ORDER — OXYTOCIN 40 UNITS IN NORMAL SALINE INFUSION - SIMPLE MED
1.0000 m[IU]/min | INTRAVENOUS | Status: DC
  Administered 2019-12-30: 09:00:00 2 m[IU]/min via INTRAVENOUS
  Filled 2019-12-30: qty 1000

## 2019-12-30 MED ORDER — SOD CITRATE-CITRIC ACID 500-334 MG/5ML PO SOLN: 30.0000 mL | ORAL | Status: DC | PRN

## 2019-12-30 MED ORDER — PHENYLEPHRINE 40 MCG/ML (10ML) SYRINGE FOR IV PUSH (FOR BLOOD PRESSURE SUPPORT)
PREFILLED_SYRINGE | INTRAVENOUS | Status: AC
  Filled 2019-12-30: qty 10

## 2019-12-30 MED ORDER — PRENATAL MULTIVITAMIN CH
1.0000 | ORAL_TABLET | Freq: Every day | ORAL | Status: DC
  Administered 2019-12-31 – 2020-01-01 (×2): 1 via ORAL
  Filled 2019-12-30 (×2): qty 1

## 2019-12-30 MED ORDER — LIDOCAINE HCL (PF) 1 % IJ SOLN
30.0000 mL | INTRAMUSCULAR | Status: AC | PRN
  Administered 2019-12-30: 30 mL via SUBCUTANEOUS
  Filled 2019-12-30: qty 30

## 2019-12-30 MED ORDER — ONDANSETRON HCL 4 MG PO TABS: 4.0000 mg | ORAL_TABLET | ORAL | Status: DC | PRN

## 2019-12-30 MED ORDER — ACETAMINOPHEN 325 MG PO TABS: 650.0000 mg | ORAL_TABLET | ORAL | Status: DC | PRN

## 2019-12-30 MED ORDER — IBUPROFEN 600 MG PO TABS
600.0000 mg | ORAL_TABLET | Freq: Four times a day (QID) | ORAL | Status: DC
  Administered 2019-12-30 – 2020-01-01 (×8): 600 mg via ORAL
  Filled 2019-12-30 (×7): qty 1

## 2019-12-30 MED ORDER — ONDANSETRON HCL 4 MG/2ML IJ SOLN: 4.0000 mg | INTRAMUSCULAR | Status: DC | PRN

## 2019-12-30 MED ORDER — BENZOCAINE-MENTHOL 20-0.5 % EX AERO
1.0000 "application " | INHALATION_SPRAY | CUTANEOUS | Status: DC | PRN
  Administered 2019-12-30: 1 via TOPICAL
  Filled 2019-12-30: qty 56

## 2019-12-30 MED ORDER — OXYCODONE HCL 5 MG PO TABS: 10.0000 mg | ORAL_TABLET | ORAL | Status: DC | PRN

## 2019-12-30 MED ORDER — OXYTOCIN BOLUS FROM INFUSION
500.0000 mL | Freq: Once | INTRAVENOUS | Status: AC
  Administered 2019-12-30: 500 mL via INTRAVENOUS

## 2019-12-30 MED ORDER — OXYTOCIN 40 UNITS IN NORMAL SALINE INFUSION - SIMPLE MED: 2.5000 [IU]/h | INTRAVENOUS | Status: DC

## 2019-12-30 MED ORDER — SIMETHICONE 80 MG PO CHEW: 80.0000 mg | CHEWABLE_TABLET | ORAL | Status: DC | PRN

## 2019-12-30 MED ORDER — ONDANSETRON HCL 4 MG/2ML IJ SOLN: 4.0000 mg | Freq: Four times a day (QID) | INTRAMUSCULAR | Status: DC | PRN

## 2019-12-30 MED ORDER — LIDOCAINE HCL (PF) 1 % IJ SOLN
INTRAMUSCULAR | Status: DC | PRN
  Administered 2019-12-30: 10 mL via EPIDURAL

## 2019-12-30 MED ORDER — WITCH HAZEL-GLYCERIN EX PADS: 1.0000 "application " | MEDICATED_PAD | CUTANEOUS | Status: DC | PRN

## 2019-12-30 MED ORDER — SENNOSIDES-DOCUSATE SODIUM 8.6-50 MG PO TABS
2.0000 | ORAL_TABLET | ORAL | Status: DC
  Administered 2019-12-30 – 2019-12-31 (×2): 2 via ORAL
  Filled 2019-12-30 (×2): qty 2

## 2019-12-30 MED ORDER — DIPHENHYDRAMINE HCL 50 MG/ML IJ SOLN: 12.5000 mg | INTRAMUSCULAR | Status: DC | PRN

## 2019-12-30 MED ORDER — FENTANYL-BUPIVACAINE-NACL 0.5-0.125-0.9 MG/250ML-% EP SOLN
EPIDURAL | Status: AC
  Filled 2019-12-30: qty 250

## 2019-12-30 MED ORDER — OXYCODONE HCL 5 MG PO TABS: 5.0000 mg | ORAL_TABLET | ORAL | Status: DC | PRN

## 2019-12-30 MED ORDER — DIBUCAINE (PERIANAL) 1 % EX OINT: 1.0000 "application " | TOPICAL_OINTMENT | CUTANEOUS | Status: DC | PRN

## 2019-12-30 NOTE — Anesthesia Preprocedure Evaluation (Signed)

## 2019-12-30 NOTE — MAU Note (Signed)
Covid swab obtained without difficulty and pt tol well. No symptoms 

## 2019-12-30 NOTE — Lactation Note (Signed)
This note was copied from a baby's chart. Lactation Consultation Note  Patient Name: Selena Murray [id number] Date: 12/30/2019 Reason for consult: Initial assessment;Term;Primapara;1st time breastfeeding  P1 mother whose infant is now 6 hours old.  This is a term baby at 40+3 weeks.  Baby was asleep in mother's arms when I arrived.  Mother was excited to inform me that her son fed for 30 minutes and she felt like the latch was good; mother denied pain with feeding.  Encouraged to feed 8-12 times/24 hours or sooner if baby shows feeding cues.  Reviewed feeding cues with mother.  She has been able to hand express drops of colostrum.  Container provided and milk storage times reviewed.  Finger feeding demonstrated.  Suggested mother continue to practice hand expression and breast massage to provide good stimulation to her breasts.  Mother verbalized understanding.  Mom made aware of O/P services, breastfeeding support groups, community resources, and our phone # for post-discharge questions. Mother has a DEBP for home use.  Support person present.  She will call as needed for latch assistance.    Maternal Data Formula Feeding for Exclusion: Yes Has patient been taught Hand Expression?: Yes Does the patient have breastfeeding experience prior to this delivery?: No  Feeding Feeding Type: Breast Fed  LATCH Score Latch: Repeated attempts needed to sustain latch, nipple held in mouth throughout feeding, stimulation needed to elicit sucking reflex.  Audible Swallowing: A few with stimulation  Type of Nipple: Everted at rest and after stimulation  Comfort (Breast/Nipple): Soft / non-tender  Hold (Positioning): Assistance needed to correctly position infant at breast and maintain latch.  LATCH Score: 7  Interventions    Lactation Tools Discussed/Used     Consult Status Consult Status: Follow-up Date: 12/31/19 Follow-up type: In-patient    Jheremy Boger R Alexa Golebiewski 12/30/2019, 6:10  PM

## 2019-12-30 NOTE — H&P (Signed)
24 y.o. G1P0000 @ [redacted]w[redacted]d presents to MAU with irregular contractions and possible ROM.  She is unsure when ROM occurred, but denies LOF yesterday before bed.  She was ruled in for rupture in MAU with positive fern.  Otherwise has good fetal movement and no bleeding.  Her pregnancy has been uncomplicated with the exception of late entry to prenatal care at 14 weeks  Past Medical History:  Diagnosis Date  . ADHD (attention deficit hyperactivity disorder)   . Ovarian cyst     Past Surgical History:  Procedure Laterality Date  . TONSILLECTOMY    . WISDOM TOOTH EXTRACTION      OB History  Gravida Para Term Preterm AB Living  1 0 0 0 0 0  SAB TAB Ectopic Multiple Live Births  0 0 0 0 0    # Outcome Date GA Lbr Len/2nd Weight Sex Delivery Anes PTL Lv  1 Current             Social History   Socioeconomic History  . Marital status: Single    Spouse name: Not on file  . Number of children: Not on file  . Years of education: Not on file  . Highest education level: Not on file  Occupational History  . Not on file  Tobacco Use  . Smoking status: Never Smoker  . Smokeless tobacco: Never Used  Substance and Sexual Activity  . Alcohol use: Not Currently  . Drug use: No  . Sexual activity: Not Currently    Birth control/protection: Condom    Comment: 1st intercourse 24 yo-More than 5 partners  Other Topics Concern  . Not on file  Social History Narrative  . Not on file       Gold-containing drug products    Prenatal Transfer Tool  Maternal Diabetes: No Genetic Screening: Normal Maternal Ultrasounds/Referrals: Normal Fetal Ultrasounds or other Referrals:  None Maternal Substance Abuse:  No Significant Maternal Medications:  None Significant Maternal Lab Results: Rh negative  ABO, Rh: O/Negative/-- (09/14 0000) Antibody: Negative (09/14 0000) Rubella: Immune (09/14 0000) RPR: Nonreactive (09/14 0000)  HBsAg: Negative (09/14 0000)  HIV: Non-reactive (09/14 0000)  GBS:  Negative/-- (02/17 0000)     Other PNC: uncomplicated.    Vitals:   12/30/19 0638  BP: 138/76  Pulse: (!) 109  Resp: 19  Temp: 98.9 F (37.2 C)     General:  NAD Abdomen:  soft, gravid, EFW 7.5-8# Ex:  no edema SVE:  4/90/-2 FHTs:  140s, moderate variability, category 1 Toco:  q3-6 min    A/P   24 y.o. G1P0000 [redacted]w[redacted]d presents with rupture of membranes Early labor--irregular contractions--will augment with pitocin Epidural upon request FSR/ vtx/ GBS negative  Carlyle Achenbach GEFFEL Khylen Riolo

## 2019-12-30 NOTE — Anesthesia Procedure Notes (Signed)
Epidural Patient location during procedure: OB Start time: 12/30/2019 10:05 AM End time: 12/30/2019 10:14 AM  Staffing Anesthesiologist: Lucretia Kern, MD Performed: anesthesiologist   Preanesthetic Checklist Completed: patient identified, IV checked, risks and benefits discussed, monitors and equipment checked, pre-op evaluation and timeout performed  Epidural Patient position: sitting Prep: DuraPrep Patient monitoring: heart rate, continuous pulse ox and blood pressure Approach: midline Location: L3-L4 Injection technique: LOR air  Needle:  Needle type: Tuohy  Needle gauge: 17 G Needle length: 9 cm Needle insertion depth: 7 cm Catheter type: closed end flexible Catheter size: 19 Gauge Catheter at skin depth: 12 cm Test dose: negative  Assessment Events: blood not aspirated, injection not painful, no injection resistance, no paresthesia and negative IV test  Additional Notes Reason for block:procedure for pain

## 2019-12-30 NOTE — MAU Note (Signed)
Contractions that have been going on since yesterday-now 6 minutes apart.  Some bloody show.  Closed on last exam.  Endorses + FM.   Feels like she could possibly be leaking some clear fluid.

## 2019-12-31 LAB — CBC
HCT: 31.5 % — ABNORMAL LOW (ref 36.0–46.0)
Hemoglobin: 10.2 g/dL — ABNORMAL LOW (ref 12.0–15.0)
MCH: 29.9 pg (ref 26.0–34.0)
MCHC: 32.4 g/dL (ref 30.0–36.0)
MCV: 92.4 fL (ref 80.0–100.0)
Platelets: 244 10*3/uL (ref 150–400)
RBC: 3.41 MIL/uL — ABNORMAL LOW (ref 3.87–5.11)
RDW: 14.3 % (ref 11.5–15.5)
WBC: 10.9 10*3/uL — ABNORMAL HIGH (ref 4.0–10.5)
nRBC: 0 % (ref 0.0–0.2)

## 2019-12-31 MED ORDER — FENTANYL-BUPIVACAINE-NACL 0.5-0.125-0.9 MG/250ML-% EP SOLN
EPIDURAL | Status: AC
  Filled 2019-12-31: qty 250

## 2019-12-31 NOTE — Discharge Summary (Signed)
Obstetric Discharge Summary Reason for Admission: onset of labor Prenatal Procedures: NST Intrapartum Procedures: spontaneous vaginal delivery Postpartum Procedures: none Complications-Operative and Postpartum: 2 degree perineal laceration Hemoglobin  Date Value Ref Range Status  12/31/2019 10.2 (L) 12.0 - 15.0 g/dL Final   HCT  Date Value Ref Range Status  12/31/2019 31.5 (L) 36.0 - 46.0 % Final    Discharge Diagnoses: Term Pregnancy-delivered  Discharge Information: Date: 12/31/2019 Activity: pelvic rest Diet: routine Medications: Ibuprofen Condition: stable Instructions: refer to practice specific booklet Discharge to: home Follow-up Information    Marlow Baars, MD. Schedule an appointment as soon as possible for a visit in 4 week(s).   Specialty: Obstetrics Contact information: [address] [place] [address] [phone]           Newborn Data: Live born female  Birth Weight: 8 lb 7.1 oz (3830 g) APGAR: 9, 9  Newborn Delivery   Birth date/time: 12/30/2019 12:37:00 Delivery type: Vaginal, Spontaneous      Home with mother.  Loney Laurence 12/31/2019, 7:57 AM

## 2019-12-31 NOTE — Anesthesia Postprocedure Evaluation (Signed)
Anesthesia Post Note  Patient: Selena Murray  Procedure(s) Performed: AN AD HOC LABOR EPIDURAL     Patient location during evaluation: Mother Baby Anesthesia Type: Epidural Level of consciousness: awake Pain management: satisfactory to patient Vital Signs Assessment: post-procedure vital signs reviewed and stable Respiratory status: spontaneous breathing Cardiovascular status: stable Anesthetic complications: no    Last Vitals:  Vitals:   12/31/19 0320 12/31/19 0508  BP: 118/75 127/85  Pulse: 98   Resp: 16 18  Temp: 36.9 C 36.7 C  SpO2: 99% 100%    Last Pain:  Vitals:   12/31/19 0508  TempSrc: Oral  PainSc: 0-No pain   Pain Goal:                   [employer]

## 2019-12-31 NOTE — Lactation Note (Signed)
This note was copied from a baby's chart. Lactation Consultation Note  Patient Name: Selena Murray [id number] Date: 12/31/2019   LC came back for feeding assist at 8:29 pm but baby was still asleep. Mom asked if LC could come back later, LC will be back around 9:30 to assist with the next feeding since baby is still asleep but mom aware she should wake baby up to feed in an hour if baby has not woken up on his own. LC to follow up with mom tonight.   Maternal Data    Feeding Feeding Type: Bottle Fed - Formula Nipple Type: Slow - flow  LATCH Score                   Interventions    Lactation Tools Discussed/Used     Consult Status      Karsyn Jamie S Bobi Daudelin 12/31/2019, 8:29 PM

## 2019-12-31 NOTE — Lactation Note (Signed)
This note was copied from a baby's chart. Lactation Consultation Note  Patient Name: Selena Murray [id number] Date: 12/31/2019   Visited with mom of a 30 hours old FT female, RN Richardson Landry had already reported to [facility] that baby was having difficulties latching on. Mom told LC she just fed baby some formula, he took 26 ml of Similac at 5:30 pm. LC offered assistance for the next feeding and will be back to check on mom for the 8:30 pm feeding.   Mom was set up with a DEBP but not pumping every 3 hours, she was encouraged to do so and LC emphasized that pumping early on is mainly for breast stimulation and not to get volume, mom and GOB voiced understanding.  Encouraged mo to keep offering the breast on cues and to pump whenever baby is getting a bottle of formula to protect her supply. LC to follow up later tonight with mom for feeding assist.   Maternal Data    Feeding Feeding Type: Bottle Fed - Formula Nipple Type: Slow - flow  LATCH Score                   Interventions    Lactation Tools Discussed/Used     Consult Status      Selena Murray 12/31/2019, 6:47 PM

## 2019-12-31 NOTE — Lactation Note (Signed)
This note was copied from a baby's chart. Lactation Consultation Note  Patient Name: Selena Murray [id number] Date: 12/31/2019 Reason for consult: Follow-up assessment;Mother's request;Difficult latch;Term P1, 15 hour female term infant. Tools given: hand pump to help evert nipple shaft out more mom has flat nipples that are compressible and responds well to stimulation. Mom doesn't need a nipple shield. Per mom, infant has been spitty (emesis) infant had emesis medium amount when LC was  in room. Mom taught back hand expression and infant was given 5 mls of colostrum by spoon. LC did suck training with infant and infant has coordinate suck 1:1. LC had mom to do breast stimulation, mom expressed a small amount of colostrum out of breast prior to infant latching. Mom latched infant on right breast using the football hold position, infant latched wide mouth, nose and chin touching breast, infant sustained latch for 8 minutes. Infant appeared content after feeding. Mom knows to call RN or LC if she needs assistance with latching infant at breast.  Mom will continue to breastfeed infant according to hunger cues, 8 to 12 times within 24 hours and not exceed 3 hours without breastfeeding infant. Mom knows to seek help if infant is not latching at breast and to hand express and given infant back volume.   Maternal Data    Feeding Feeding Type: Breast Fed  LATCH Score Latch: Grasps breast easily, tongue down, lips flanged, rhythmical sucking.  Audible Swallowing: Spontaneous and intermittent  Type of Nipple: Flat  Comfort (Breast/Nipple): Soft / non-tender  Hold (Positioning): Assistance needed to correctly position infant at breast and maintain latch.  LATCH Score: 8  Interventions Interventions: Assisted with latch;Adjust position;Breast compression;Skin to skin;Support pillows;Breast massage;Position options;Hand express;Expressed milk;Pre-pump if needed;Hand pump  Lactation Tools  Discussed/Used Tools: Pump Breast pump type: Manual   Consult Status Consult Status: Follow-up Date: 12/31/19 Follow-up type: In-patient    Danelle Earthly 12/31/2019, 4:01 AM

## 2019-12-31 NOTE — Progress Notes (Addendum)
Patient is eating, ambulating, voiding.  Pain control is good.  Vitals:   12/30/19 1930 12/30/19 2315 12/31/19 0320 12/31/19 0508  BP: 131/73 124/85 118/75 127/85  Pulse: 99 83 98   Resp: 16 18 16 18   Temp: 98.3 F (36.8 C) 98.8 F (37.1 C) 98.4 F (36.9 C) 98 F (36.7 C)  TempSrc: Oral Oral Oral Oral  SpO2: 99% 100% 99% 100%  Weight:      Height:        Fundus firm Perineum without swelling.  Lab Results  Component Value Date   WBC 10.9 (H) 12/31/2019   HGB 10.2 (L) 12/31/2019   HCT 31.5 (L) 12/31/2019   MCV 92.4 12/31/2019   PLT 244 12/31/2019    --/--/O NEG, O NEG Performed at [facility], [address], [place], [place]  (03/18 0724)/[place]  A/P Post partum day 1.  Routine care.  Expect d/c routine.   Parents desires circumsision.  All risks, benefits and alternatives discussed with the mother. Baby is O NEG> no rhogam. 07-17-1973

## 2020-01-01 ENCOUNTER — Other Ambulatory Visit (HOSPITAL_COMMUNITY): Payer: Self-pay

## 2020-01-01 ENCOUNTER — Other Ambulatory Visit (HOSPITAL_COMMUNITY): Payer: 59

## 2020-01-01 NOTE — Lactation Note (Signed)
This note was copied from a baby's chart. Lactation Consultation Note  Patient Name: Boy Fabiola Mudgett [id number] Date: 01/01/2020 Reason for consult: Follow-up assessment   P1, Baby 44 hours old.  Mother has had difficulty latching baby. He has been primarily formula fed.  Mother states when he has latched, she has used the nipple shield.  Mother misplaced her NS so LC provided her with #20NS. Attempted latching baby but became spitty. DEBP is set up in mother's room and although encouraged to pump mother has not been pumping. Mother states she has 2 DEBP at home, does not recall the brands. Encouraged her to pump q 3 hours if she wants to establish her milk supply. Feed on demand with cues.  Goal 8-12+ times per day after first 24 hrs.  Place baby STS if not cueing.  Reviewed engorgement care and monitoring voids/stools. Suggest mother call when baby cues next to help with feeding.    Maternal Data Has patient been taught Hand Expression?: Yes  Feeding Feeding Type: Bottle Fed - Formula Nipple Type: Slow - flow  LATCH Score                   Interventions Interventions: Breast feeding basics reviewed;Assisted with latch;Hand express;DEBP  Lactation Tools Discussed/Used     Consult Status Consult Status: Follow-up Date: 01/02/20 Follow-up type: In-patient    Dahlia Byes [facility] 01/01/2020, 8:49 AM

## 2020-01-01 NOTE — Lactation Note (Signed)
This note was copied from a baby's chart. Lactation Consultation Note Attempted to see mom, mom sleeping.  Patient Name: Selena Murray [id number] Date: 01/01/2020     Maternal Data    Feeding Feeding Type: Bottle Fed - Formula Nipple Type: Slow - flow  LATCH Score                   Interventions    Lactation Tools Discussed/Used     Consult Status      Heavenlee Maiorana G 01/01/2020, 3:56 AM

## 2020-01-01 NOTE — Progress Notes (Signed)
Patient is eating, ambulating, voiding.  Pain control is good.  Vitals:   12/31/19 0508 12/31/19 1500 12/31/19 2228 01/01/20 0500  BP: 127/85 127/74 128/73 132/68  Pulse:  94 72 76  Resp: 18 18 16 18   Temp: 98 F (36.7 C)  98.6 F (37 C) 98.2 F (36.8 C)  TempSrc: Oral  Oral Oral  SpO2: 100%  100% 100%  Weight:      Height:        Fundus firm Perineum without swelling.  Lab Results  Component Value Date   WBC 10.9 (H) 12/31/2019   HGB 10.2 (L) 12/31/2019   HCT 31.5 (L) 12/31/2019   MCV 92.4 12/31/2019   PLT 244 12/31/2019    --/--/O NEG, O NEG Performed at [facility], [address] [place], [place]  (03/18 0724)/[place]  A/P Post partum day 2.  Routine care.  Expect d/c today.  Baby is RH NEG.    07-17-1973

## 2020-01-01 NOTE — Lactation Note (Signed)
This note was copied from a baby's chart. Lactation Consultation Note  Patient Name: Selena Murray [id number] Date: 01/01/2020 Reason for consult: Follow-up assessment   Returned to room to assist mother with latching and baby was still sleepy. Left baby STS on mother's chest and recommend STS often to interest him in feeding. Mother knows to wake baby for feedings and attempt bf with each feeding. Encouraged her to post pump.  She has 2 personal DEBP at home. Reviewed engorgement care and monitoring voids/stools. Also recommend she attend OP lactation appt. To work on breastfeeding.    Maternal Data Has patient been taught Hand Expression?: Yes  Feeding    LATCH Score                   Interventions Interventions: Breast feeding basics reviewed;DEBP  Lactation Tools Discussed/Used     Consult Status Consult Status: Complete Date: 01/01/20 Follow-up type: In-patient    Dahlia Byes [facility] 01/01/2020, 10:29 AM

## 2020-01-03 ENCOUNTER — Inpatient Hospital Stay (HOSPITAL_COMMUNITY): Payer: 59

## 2020-01-03 ENCOUNTER — Inpatient Hospital Stay (HOSPITAL_COMMUNITY): Admission: AD | Admit: 2020-01-03 | Payer: 59 | Source: Home / Self Care | Admitting: Obstetrics and Gynecology

## 2020-08-25 ENCOUNTER — Ambulatory Visit
Admission: EM | Admit: 2020-08-25 | Discharge: 2020-08-25 | Disposition: A | Discharge status: Home / Self Care | Payer: BC Managed Care – PPO | Attending: Emergency Medicine | Admitting: Emergency Medicine

## 2020-08-25 ENCOUNTER — Other Ambulatory Visit: Payer: Self-pay

## 2020-08-25 DIAGNOSIS — H60391 Other infective otitis externa, right ear: Secondary | ICD-10-CM | POA: Diagnosis not present

## 2020-08-25 MED ORDER — NEOMYCIN-POLYMYXIN-HC 3.5-10000-1 OT SOLN: 3.0000 [drp] | Freq: Three times a day (TID) | OTIC | 0 refills | Status: DC

## 2020-08-25 NOTE — ED Triage Notes (Signed)
Pt states she has been experiencing right ear pain and pressure x 3 days. Pt is aox4 and ambulatory.

## 2020-08-25 NOTE — ED Provider Notes (Signed)
[facility]    CSN: [id number] Arrival date & time: 08/25/20  1558      History   Chief Complaint Chief Complaint  Patient presents with  . Otalgia    right x 2 days    HPI Selena Murray is a 24 y.o. female  Presenting for right ear pain for the last 3 days.  Endorsing history of swimmer's ear: Feels similar.  Was at the beach recently.  Denies trauma, discharge, fever, travel.  Past Medical History:  Diagnosis Date  . ADHD (attention deficit hyperactivity disorder)   . Ovarian cyst     Patient Active Problem List   Diagnosis Date Noted  . Premature labor with rupture of membranes 12/30/2019    Past Surgical History:  Procedure Laterality Date  . TONSILLECTOMY    . WISDOM TOOTH EXTRACTION      OB History    Gravida  1   Para  1   Term  1   Preterm  0   AB  0   Living  1     SAB  0   TAB  0   Ectopic  0   Multiple  0   Live Births  1            Home Medications    Prior to Admission medications   Medication Sig Start Date End Date Taking? Authorizing Provider  neomycin-polymyxin-hydrocortisone (CORTISPORIN) OTIC solution Place 3 drops into the right ear 3 (three) times daily. 08/25/20   Hall-Potvin, [place], PA-C    Family History Family History  Problem Relation Age of Onset  . Heart disease Maternal Grandmother   . Hyperlipidemia Maternal Grandmother   . Hypertension Maternal Grandmother     Social History Social History   Tobacco Use  . Smoking status: Never Smoker  . Smokeless tobacco: Never Used  Vaping Use  . Vaping Use: Never used  Substance Use Topics  . Alcohol use: Not Currently  . Drug use: No     Allergies   Gold-containing drug products   Review of Systems Review of Systems  Constitutional: Negative for fatigue and fever.  HENT: Positive for ear pain. Negative for congestion, dental problem, ear discharge, facial swelling, hearing loss, sinus pain, sore throat, trouble  swallowing and voice change.   Eyes: Negative for photophobia, pain, redness and visual disturbance.  Respiratory: Negative for cough and shortness of breath.   Cardiovascular: Negative for chest pain and palpitations.  Gastrointestinal: Negative for abdominal pain, diarrhea and vomiting.  Musculoskeletal: Negative for arthralgias and myalgias.  Skin: Negative for rash and wound.  Neurological: Negative for dizziness, syncope and headaches.     Physical Exam Triage Vital Signs ED Triage Vitals  Enc Vitals Group     BP      Pulse      Resp      Temp      Temp src      SpO2      Weight      Height      Head Circumference      Peak Flow      Pain Score      Pain Loc      Pain Edu?      Excl. in GC?    No data found.  Updated Vital Signs BP 112/78 (BP Location: Left Arm)   Pulse 96   Temp 98.2 F (36.8 C) (Oral)   Resp 18   LMP 07/30/2020 (Exact Date)  SpO2 98%   Visual Acuity Right Eye Distance:   Left Eye Distance:   Bilateral Distance:    Right Eye Near:   Left Eye Near:    Bilateral Near:     Physical Exam Constitutional:      General: She is not in acute distress. HENT:     Head: Normocephalic and atraumatic.     Jaw: There is normal jaw occlusion. No tenderness or pain on movement.     Right Ear: Hearing and tympanic membrane normal. No tenderness. No mastoid tenderness.     Left Ear: Hearing, tympanic membrane, ear canal and external ear normal. No tenderness. No mastoid tenderness.     Ears:     Comments: Right ear with tragal tenderness.  EAC mildly edematous, erythematous with discharge.  No FB    Nose: No nasal deformity, septal deviation or nasal tenderness.     Right Turbinates: Not swollen or pale.     Left Turbinates: Not swollen or pale.     Right Sinus: No maxillary sinus tenderness or frontal sinus tenderness.     Left Sinus: No maxillary sinus tenderness or frontal sinus tenderness.     Mouth/Throat:     Lips: Pink. No lesions.      Mouth: Mucous membranes are moist. No injury.     Pharynx: Oropharynx is clear. Uvula midline. No posterior oropharyngeal erythema or uvula swelling.     Comments: no tonsillar exudate or hypertrophy Cardiovascular:     Rate and Rhythm: Normal rate.  Pulmonary:     Effort: Pulmonary effort is normal.  Musculoskeletal:     Cervical back: Normal range of motion and neck supple. No muscular tenderness.  Lymphadenopathy:     Cervical: No cervical adenopathy.  Neurological:     Mental Status: She is alert and oriented to person, place, and time.      UC Treatments / Results  Labs (all labs ordered are listed, but only abnormal results are displayed) Labs Reviewed - No data to display  EKG   Radiology No results found.  Procedures Procedures (including critical care time)  Medications Ordered in UC Medications - No data to display  Initial Impression / Assessment and Plan / UC Course  I have reviewed the triage vital signs and the nursing notes.  Pertinent labs & imaging results that were available during my care of the patient were reviewed by me and considered in my medical decision making (see chart for details).     We will treat for AOE as below.  Return precautions discussed, pt verbalized understanding and is agreeable to plan. Final Clinical Impressions(s) / UC Diagnoses   Final diagnoses:  Infective otitis externa of right ear     Discharge Instructions     Use eardrops as prescribed for the next week. Return for worsening ear pain, swelling, discharge, bleeding, decreased hearing, development of jaw pain/swelling, fever.  Do NOT use Q-tips as these can cause your ear wax to get stuck, the tips may break off and become a foreign body requiring additional medical care, or puncture your eardrum.  Helpful prevention tip: Use a solution of equal parts isopropyl (rubbing) alcohol and white vinegar (acetic acid) in both ears after swimming.    ED  Prescriptions    Medication Sig Dispense Auth. Provider   neomycin-polymyxin-hydrocortisone (CORTISPORIN) OTIC solution Place 3 drops into the right ear 3 (three) times daily. 10 mL Hall-Potvin, [place], PA-C     PDMP not reviewed this encounter.  Hall-Potvin, [place], [place] 08/25/20 1759

## 2020-08-25 NOTE — Discharge Instructions (Addendum)
Use eardrops as prescribed for the next week. Return for worsening ear pain, swelling, discharge, bleeding, decreased hearing, development of jaw pain/swelling, fever.  Do NOT use Q-tips as these can cause your ear wax to get stuck, the tips may break off and become a foreign body requiring additional medical care, or puncture your eardrum.  Helpful prevention tip: Use a solution of equal parts isopropyl (rubbing) alcohol and white vinegar (acetic acid) in both ears after swimming. 

## 2020-11-13 DIAGNOSIS — O039 Complete or unspecified spontaneous abortion without complication: Secondary | ICD-10-CM | POA: Insufficient documentation

## 2021-10-03 ENCOUNTER — Other Ambulatory Visit: Payer: Self-pay

## 2021-10-03 ENCOUNTER — Ambulatory Visit
Admission: EM | Admit: 2021-10-03 | Discharge: 2021-10-03 | Disposition: A | Discharge status: Home / Self Care | Payer: BC Managed Care – PPO | Attending: Internal Medicine | Admitting: Internal Medicine

## 2021-10-03 DIAGNOSIS — J069 Acute upper respiratory infection, unspecified: Secondary | ICD-10-CM | POA: Diagnosis not present

## 2021-10-03 MED ORDER — PROMETHAZINE-DM 6.25-15 MG/5ML PO SYRP: 5.0000 mL | ORAL_SOLUTION | Freq: Four times a day (QID) | ORAL | 0 refills | Status: DC | PRN

## 2021-10-03 NOTE — ED Provider Notes (Signed)
[facility]    CSN: [id number] Arrival date & time: 10/03/21  1112      History   Chief Complaint Chief Complaint  Patient presents with   Cough    HPI Selena Murray is a 25 y.o. female.   Patient presents with nonproductive cough, nasal congestion, sore throat, mild diarrhea, nausea, vomiting, body aches, fever.  Patient reports that she has a known sick contact at her workplace.  Denies any known fevers but states that she "felt feverish".  Has taken over-the-counter Mucinex with minimal improvement in symptoms.  Denies chest pain, shortness of breath, abdominal pain.   Cough  Past Medical History:  Diagnosis Date   ADHD (attention deficit hyperactivity disorder)    Ovarian cyst     Patient Active Problem List   Diagnosis Date Noted   Premature labor with rupture of membranes 12/30/2019    Past Surgical History:  Procedure Laterality Date   TONSILLECTOMY     WISDOM TOOTH EXTRACTION      OB History     Gravida  1   Para  1   Term  1   Preterm  0   AB  0   Living  1      SAB  0   IAB  0   Ectopic  0   Multiple  0   Live Births  1            Home Medications    Prior to Admission medications   Medication Sig Start Date End Date Taking? Authorizing Provider  promethazine-dextromethorphan (PROMETHAZINE-DM) 6.25-15 MG/5ML syrup Take 5 mLs by mouth 4 (four) times daily as needed for cough. 10/03/21  Yes Tera Pellicane, [place] E, FNP  neomycin-polymyxin-hydrocortisone (CORTISPORIN) OTIC solution Place 3 drops into the right ear 3 (three) times daily. 08/25/20   Hall-Potvin, [place], PA-C    Family History Family History  Problem Relation Age of Onset   Heart disease Maternal Grandmother    Hyperlipidemia Maternal Grandmother    Hypertension Maternal Grandmother     Social History Social History   Tobacco Use   Smoking status: Never   Smokeless tobacco: Never  Vaping Use   Vaping Use: Never used  Substance Use  Topics   Alcohol use: Not Currently   Drug use: No     Allergies   Gold-containing drug products   Review of Systems Review of Systems Per HPI  Physical Exam Triage Vital Signs ED Triage Vitals  Enc Vitals Group     BP 10/03/21 1219 110/71     Pulse Rate 10/03/21 1219 90     Resp 10/03/21 1219 18     Temp 10/03/21 1219 98.3 F (36.8 C)     Temp Source 10/03/21 1219 Oral     SpO2 10/03/21 1219 98 %     Weight --      Height --      Head Circumference --      Peak Flow --      Pain Score 10/03/21 1220 0     Pain Loc --      Pain Edu? --      Excl. in GC? --    No data found.  Updated Vital Signs BP 110/71 (BP Location: Right Arm)    Pulse 90    Temp 98.3 F (36.8 C) (Oral)    Resp 18    SpO2 98%    Breastfeeding No   Visual Acuity Right Eye Distance:  Left Eye Distance:   Bilateral Distance:    Right Eye Near:   Left Eye Near:    Bilateral Near:     Physical Exam Constitutional:      General: She is not in acute distress.    Appearance: Normal appearance. She is not toxic-appearing or diaphoretic.  HENT:     Head: Normocephalic and atraumatic.     Right Ear: Tympanic membrane and ear canal normal.     Left Ear: Tympanic membrane and ear canal normal.     Nose: Congestion present.     Mouth/Throat:     Mouth: Mucous membranes are moist.     Pharynx: No posterior oropharyngeal erythema.  Eyes:     Extraocular Movements: Extraocular movements intact.     Conjunctiva/sclera: Conjunctivae normal.     Pupils: Pupils are equal, round, and reactive to light.  Cardiovascular:     Rate and Rhythm: Normal rate and regular rhythm.     Pulses: Normal pulses.     Heart sounds: Normal heart sounds.  Pulmonary:     Effort: Pulmonary effort is normal. No respiratory distress.     Breath sounds: Normal breath sounds. No stridor. No wheezing, rhonchi or rales.     Comments: Harsh nonproductive cough on exam. Abdominal:     General: Abdomen is flat. Bowel sounds  are normal.     Palpations: Abdomen is soft.  Musculoskeletal:        General: Normal range of motion.     Cervical back: Normal range of motion.  Skin:    General: Skin is warm and dry.  Neurological:     General: No focal deficit present.     Mental Status: She is alert and oriented to person, place, and time. Mental status is at baseline.  Psychiatric:        Mood and Affect: Mood normal.        Behavior: Behavior normal.     UC Treatments / Results  Labs (all labs ordered are listed, but only abnormal results are displayed) Labs Reviewed  COVID-19, FLU A+B NAA    EKG   Radiology No results found.  Procedures Procedures (including critical care time)  Medications Ordered in UC Medications - No data to display  Initial Impression / Assessment and Plan / UC Course  I have reviewed the triage vital signs and the nursing notes.  Pertinent labs & imaging results that were available during my care of the patient were reviewed by me and considered in my medical decision making (see chart for details).     Patient presents with symptoms likely from a viral upper respiratory infection. Differential includes bacterial pneumonia, sinusitis, allergic rhinitis, Covid 19, flu. Do not suspect underlying cardiopulmonary process. Symptoms seem unlikely related to ACS, CHF or COPD exacerbations, pneumonia, pneumothorax. Patient is nontoxic appearing and not in need of emergent medical intervention.  COVID-19 and flu test pending. Do not think that strep testing is necessary given appearance of posterior pharynx on exam.  Recommended symptom control with over the counter medications.  Promethazine DM prescribed for patient.  Advised patient this can cause drowsiness.  Return if symptoms fail to improve in 1-2 weeks or you develop shortness of breath, chest pain, severe headache. Patient states understanding and is agreeable.  Discharged with PCP followup.  Final Clinical  Impressions(s) / UC Diagnoses   Final diagnoses:  Viral upper respiratory tract infection with cough     Discharge Instructions  It appears that you have a viral upper respiratory infection that should resolve in the next few days with symptomatic treatment.  You have been prescribed a cough medication to take as needed for cough.  Please be advised that this cough medication can cause drowsiness.  Your COVID-19 and flu test is pending.  We will call if it is positive.    ED Prescriptions     Medication Sig Dispense Auth. Provider   promethazine-dextromethorphan (PROMETHAZINE-DM) 6.25-15 MG/5ML syrup Take 5 mLs by mouth 4 (four) times daily as needed for cough. 118 mL Gustavus Bryant, [place]      PDMP not reviewed this encounter.   Gustavus Bryant, [place] 10/03/21 1242

## 2021-10-03 NOTE — Discharge Instructions (Addendum)
It appears that you have a viral upper respiratory infection that should resolve in the next few days with symptomatic treatment.  You have been prescribed a cough medication to take as needed for cough.  Please be advised that this cough medication can cause drowsiness.  Your COVID-19 and flu test is pending.  We will call if it is positive.

## 2021-10-03 NOTE — ED Triage Notes (Signed)
Pt c/o cough, nasal congestion with drainage, body sweats, emesis, sore throat, ear aches, headache, body aches, mild diarrhea  Onset Saturday

## 2021-10-04 LAB — COVID-19, FLU A+B NAA
Influenza A, NAA: DETECTED — AB
Influenza B, NAA: NOT DETECTED
SARS-CoV-2, NAA: NOT DETECTED

## 2022-05-10 ENCOUNTER — Ambulatory Visit
Admission: EM | Admit: 2022-05-10 | Discharge: 2022-05-10 | Disposition: A | Discharge status: Home / Self Care | Payer: BC Managed Care – PPO

## 2022-05-10 DIAGNOSIS — S0990XA Unspecified injury of head, initial encounter: Secondary | ICD-10-CM | POA: Diagnosis not present

## 2022-05-10 DIAGNOSIS — R200 Anesthesia of skin: Secondary | ICD-10-CM | POA: Diagnosis not present
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
## 2022-05-10 NOTE — Discharge Instructions (Signed)
Please go to the emergency room immediately for further evaluation and management 
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
## 2022-06-12 ENCOUNTER — Ambulatory Visit
Admission: EM | Admit: 2022-06-12 | Discharge: 2022-06-12 | Disposition: A | Discharge status: Home / Self Care | Payer: BC Managed Care – PPO

## 2022-06-12 DIAGNOSIS — H60392 Other infective otitis externa, left ear: Secondary | ICD-10-CM | POA: Diagnosis not present

## 2022-06-12 MED ORDER — CIPROFLOXACIN-DEXAMETHASONE 0.3-0.1 % OT SUSP
4.0000 [drp] | Freq: Two times a day (BID) | OTIC | 0 refills | Status: DC
Start: 2022-06-12 — End: 2024-05-11

## 2022-06-12 MED ORDER — HYDROCODONE-ACETAMINOPHEN 5-325 MG PO TABS: 1.0000 | ORAL_TABLET | Freq: Every evening | ORAL | 0 refills | Status: AC | PRN
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
## 2022-06-12 NOTE — Discharge Instructions (Signed)
It appears you have an external ear infection.  Please continue the Augmentin as prescribed.  Stop Cortisporin.  Start Ciprodex.  Do not put anything in your ear.  Avoid submerging her head in water.  Follow-up with ENT soon as possible.  Call to schedule an appointment.  Use Tylenol or ibuprofen for pain during the day.  I have called in a few doses of hydrocodone for pain.  This make you sleepy so do not drive or drink alcohol with taking it.  If symptoms are not improving or if anything worsens go to the emergency room.

## 2023-11-13 ENCOUNTER — Other Ambulatory Visit: Payer: Self-pay

## 2023-11-13 ENCOUNTER — Ambulatory Visit: Payer: BC Managed Care – PPO | Admitting: Podiatry

## 2023-11-13 ENCOUNTER — Ambulatory Visit: Payer: 59 | Admitting: Podiatry

## 2023-11-13 ENCOUNTER — Encounter: Payer: Self-pay | Admitting: Podiatry

## 2023-11-13 ENCOUNTER — Ambulatory Visit: Payer: 59

## 2023-11-13 DIAGNOSIS — M79672 Pain in left foot: Secondary | ICD-10-CM

## 2023-11-13 DIAGNOSIS — M2142 Flat foot [pes planus] (acquired), left foot: Secondary | ICD-10-CM | POA: Diagnosis not present

## 2023-11-13 DIAGNOSIS — M722 Plantar fascial fibromatosis: Secondary | ICD-10-CM | POA: Diagnosis not present

## 2023-11-13 MED ORDER — MELOXICAM 15 MG PO TABS: 15.0000 mg | ORAL_TABLET | Freq: Every day | ORAL | 0 refills | Status: DC

## 2023-11-13 NOTE — Patient Instructions (Signed)

## 2023-11-13 NOTE — Progress Notes (Signed)
  Subjective:  Patient ID: Selena Murray, female    DOB: 06-24-1996,   MRN: [id number]  Chief Complaint  Patient presents with   Foot Pain    Pt presents for right heel pain that started about two months ago.    28 y.o. female presents for concern of left foot pain. Relates most of the pain is on the bottom of the foot in the heel that has been ongoing for 2 months. Relates worst pain is in the morning after getting up and does hurt a lot at the end of the day. She was wearing vans while working out and thinks that is what sparked it. She has stayed in [place] and been doing a little stretching. Denies any other treatments. . Denies any other pedal complaints. Denies n/v/f/c.   Past Medical History:  Diagnosis Date   ADHD (attention deficit hyperactivity disorder)    Ovarian cyst     Objective:  Physical Exam: Vascular: DP/PT pulses 2/4 bilateral. CFT <3 seconds. Normal hair growth on digits. No edema.  Skin. No lacerations or abrasions bilateral feet.  Musculoskeletal: MMT 5/5 bilateral lower extremities in DF, PF, Inversion and Eversion. Deceased ROM in DF of ankle joint. Tender to the medial calcaneal tubercle left . No pain with achilles, PT or arch. No pain with calcaneal squeeze.  Neurological: Sensation intact to light touch.   Assessment:   1. Plantar fasciitis of left foot      Plan:  Patient was evaluated and treated and all questions answered. Discussed plantar fasciitis with patient.  X-rays reviewed and discussed with patient. No acute fractures or dislocations noted. Minimal spurring noted at inferior calcaneus. Mild pes planus noted.   Discussed treatment options including, ice, NSAIDS, supportive shoes, bracing, and stretching. Stretching exercises provided to be done on a daily basis.   Prescription for meloxicam provided and sent to pharmacy. Previous CMP wnl for kidney function.  Pf brace dispensed.  Follow-up 6 weeks or sooner if any problems arise. In  the meantime, encouraged to call the office with any questions, concerns, change in symptoms.      Louann Sjogren, DPM

## 2023-12-03 ENCOUNTER — Other Ambulatory Visit: Payer: Self-pay

## 2023-12-03 ENCOUNTER — Ambulatory Visit
Admission: EM | Admit: 2023-12-03 | Discharge: 2023-12-03 | Disposition: A | Discharge status: Home / Self Care | Payer: 59 | Attending: Physician Assistant | Admitting: Physician Assistant

## 2023-12-03 ENCOUNTER — Encounter: Payer: Self-pay | Admitting: *Deleted

## 2023-12-03 DIAGNOSIS — J101 Influenza due to other identified influenza virus with other respiratory manifestations: Secondary | ICD-10-CM

## 2023-12-03 LAB — POC COVID19/FLU A&B COMBO
Covid Antigen, POC: NEGATIVE
Influenza A Antigen, POC: POSITIVE — AB
Influenza B Antigen, POC: NEGATIVE

## 2023-12-03 MED ORDER — OSELTAMIVIR PHOSPHATE 75 MG PO CAPS: 75.0000 mg | ORAL_CAPSULE | Freq: Two times a day (BID) | ORAL | 0 refills | Status: DC

## 2023-12-03 NOTE — ED Triage Notes (Signed)
Body aches, cough, fever 104 last night. She took OTC sudafed

## 2023-12-03 NOTE — ED Provider Notes (Signed)
[facility]    CSN: [id number] Arrival date & time: 12/03/23  1125      History   Chief Complaint Chief Complaint  Patient presents with   Fever    HPI Selena Murray is a 28 y.o. female.   Here today for evaluation of bodyaches, cough, fever that started last night.  Tmax 104.  She took over-the-counter Sudafed without resolution.  She has had some nausea and vomiting.  The history is provided by the patient.  Fever Associated symptoms: chills, congestion, cough, ear pain (mild), nausea, sore throat and vomiting   Associated symptoms: no diarrhea     Past Medical History:  Diagnosis Date   ADHD (attention deficit hyperactivity disorder)    Ovarian cyst     Patient Active Problem List   Diagnosis Date Noted   Premature labor with rupture of membranes 12/30/2019    Past Surgical History:  Procedure Laterality Date   TONSILLECTOMY     WISDOM TOOTH EXTRACTION      OB History     Gravida  1   Para  1   Term  1   Preterm  0   AB  0   Living  1      SAB  0   IAB  0   Ectopic  0   Multiple  0   Live Births  1            Home Medications    Prior to Admission medications   Medication Sig Start Date End Date Taking? Authorizing Provider  meloxicam (MOBIC) 15 MG tablet Take 1 tablet (15 mg total) by mouth daily. 11/13/23  Yes Louann Sjogren, DPM  oseltamivir (TAMIFLU) 75 MG capsule Take 1 capsule (75 mg total) by mouth every 12 (twelve) hours. 12/03/23  Yes Tomi Bamberger, PA-C  ciprofloxacin-dexamethasone (CIPRODEX) OTIC suspension Place 4 drops into the left ear 2 (two) times daily. 06/12/22   Raspet, Noberto Retort, PA-C  levonorgestrel (MIRENA, 52 MG,) 20 MCG/DAY IUD provided by [facility] 11/16/21   [provider]    Family History Family History  Problem Relation Age of Onset   Heart disease Maternal Grandmother    Hyperlipidemia Maternal Grandmother    Hypertension Maternal Grandmother     Social History Social  History   Tobacco Use   Smoking status: Never   Smokeless tobacco: Never  Vaping Use   Vaping status: Never Used  Substance Use Topics   Alcohol use: Not Currently   Drug use: No     Allergies   Gold-containing drug products   Review of Systems Review of Systems  Constitutional:  Positive for chills and fever.  HENT:  Positive for congestion, ear pain (mild) and sore throat.   Eyes:  Negative for discharge and redness.  Respiratory:  Positive for cough. Negative for shortness of breath and wheezing.   Gastrointestinal:  Positive for nausea and vomiting. Negative for abdominal pain and diarrhea.     Physical Exam Triage Vital Signs ED Triage Vitals  Encounter Vitals Group     BP 12/03/23 1142 105/62     Systolic BP Percentile --      Diastolic BP Percentile --      Pulse Rate 12/03/23 1142 (!) 117     Resp 12/03/23 1142 20     Temp 12/03/23 1142 99 F (37.2 C)     Temp Source 12/03/23 1142 Oral     SpO2 12/03/23 1142 96 %  Weight --      Height --      Head Circumference --      Peak Flow --      Pain Score 12/03/23 1139 6     Pain Loc --      Pain Education --      Exclude from Growth Chart --    No data found.  Updated Vital Signs BP 105/62 (BP Location: Left Arm)   Pulse (!) 117   Temp 99 F (37.2 C) (Oral)   Resp 20   LMP 11/30/2023   SpO2 96%   Visual Acuity Right Eye Distance:   Left Eye Distance:   Bilateral Distance:    Right Eye Near:   Left Eye Near:    Bilateral Near:     Physical Exam Vitals and nursing note reviewed.  Constitutional:      General: She is not in acute distress.    Appearance: Normal appearance. She is not ill-appearing.  HENT:     Head: Normocephalic and atraumatic.     Right Ear: Tympanic membrane normal.     Left Ear: Tympanic membrane normal.     Nose: Congestion present.     Mouth/Throat:     Mouth: Mucous membranes are moist.     Pharynx: No oropharyngeal exudate or posterior oropharyngeal erythema.   Eyes:     Conjunctiva/sclera: Conjunctivae normal.  Cardiovascular:     Rate and Rhythm: Normal rate and regular rhythm.     Heart sounds: Normal heart sounds. No murmur heard. Pulmonary:     Effort: Pulmonary effort is normal. No respiratory distress.     Breath sounds: Normal breath sounds. No wheezing, rhonchi or rales.  Skin:    General: Skin is warm and dry.  Neurological:     Mental Status: She is alert.  Psychiatric:        Mood and Affect: Mood normal.        Thought Content: Thought content normal.      UC Treatments / Results  Labs (all labs ordered are listed, but only abnormal results are displayed) Labs Reviewed  POC COVID19/FLU A&B COMBO - Abnormal; Notable for the following components:      Result Value   Influenza A Antigen, POC Positive (*)    All other components within normal limits    EKG   Radiology No results found.  Procedures Procedures (including critical care time)  Medications Ordered in UC Medications - No data to display  Initial Impression / Assessment and Plan / UC Course  I have reviewed the triage vital signs and the nursing notes.  Pertinent labs & imaging results that were available during my care of the patient were reviewed by me and considered in my medical decision making (see chart for details).    Screening positive in office.  Will treat with Tamiflu and advised symptomatic treatment, increase fluids and rest otherwise.  Encouraged follow-up if no gradual improvement with any further concerns.  Final Clinical Impressions(s) / UC Diagnoses   Final diagnoses:  Influenza A   Discharge Instructions   None    ED Prescriptions     Medication Sig Dispense Auth. Provider   oseltamivir (TAMIFLU) 75 MG capsule Take 1 capsule (75 mg total) by mouth every 12 (twelve) hours. 10 capsule Tomi Bamberger, PA-C      PDMP not reviewed this encounter.   Tomi Bamberger, PA-C 12/03/23 1214

## 2023-12-18 ENCOUNTER — Other Ambulatory Visit: Payer: Self-pay | Admitting: Podiatry

## 2023-12-25 ENCOUNTER — Ambulatory Visit: Payer: 59 | Admitting: Podiatry

## 2023-12-31 ENCOUNTER — Ambulatory Visit: Payer: 59 | Admitting: Family Medicine

## 2024-05-10 NOTE — Progress Notes (Signed)
 "   New Patient Visit  Subjective:     Patient ID: Selena Murray, female    DOB: 1995/11/28, 28 y.o.   MRN: [id number]  No chief complaint on file.   HPI  Discussed the use of AI scribe software for clinical note transcription with the patient, who gave verbal consent to proceed.  History of Present Illness Selena Murray is a 28 year old female who presents with sleep disturbances and anxiety.  Sleep disturbance - Difficulty initiating sleep characterized by a racing mind at night - No current use of sleep medications - Attributes sleep disturbance to difficulty controlling thoughts related to ADHD  Anxiety and depressive symptoms - Experiences anxiety and depressive symptoms, potentially related to ADHD - Symptoms include feeling overwhelmed, impacting ability to manage daily tasks and interactions with her child born [birth year] 30[birth year] 2019 - No prior use of medication for anxiety or depression - Open to exploring treatment options if anxiety persists  Cognitive and attention difficulties - Diagnosed with ADHD - Difficulty controlling thoughts, contributing to both sleep disturbance and anxiety - Considering initiation of Vyvanse but has not yet started the medication and is unsure if the prescription has been sent     ROS Per HPI  Outpatient Encounter Medications as of 05/11/2024  Medication Sig   [DISCONTINUED] ciprofloxacin -dexamethasone  (CIPRODEX ) OTIC suspension Place 4 drops into the left ear 2 (two) times daily.   [DISCONTINUED] levonorgestrel (MIRENA, 52 MG,) 20 MCG/DAY IUD provided by [facility]   [DISCONTINUED] meloxicam  (MOBIC ) 15 MG tablet Take 1 tablet by mouth once daily with food   [DISCONTINUED] oseltamivir  (TAMIFLU ) 75 MG capsule Take 1 capsule (75 mg total) by mouth every 12 (twelve) hours.   No facility-administered encounter medications on file as of 05/11/2024.    Past Medical History:  Diagnosis Date   ADHD (attention deficit hyperactivity  disorder)    Anxiety 2008   Depression 2008   Ovarian cyst     Past Surgical History:  Procedure Laterality Date   TONSILLECTOMY     WISDOM TOOTH EXTRACTION      Family History  Problem Relation Age of Onset   ADD / ADHD Mother    Depression Mother    Heart disease Maternal Grandmother    Hyperlipidemia Maternal Grandmother    Hypertension Maternal Grandmother    Asthma Son     Social History   Socioeconomic History   Marital status: Single    Spouse name: Not on file   Number of children: Not on file   Years of education: Not on file   Highest education level: Some college, no degree  Occupational History   Not on file  Tobacco Use   Smoking status: Never   Smokeless tobacco: Never  Vaping Use   Vaping status: Never Used  Substance and Sexual Activity   Alcohol use: Yes    Alcohol/week: 1.0 standard drink of alcohol    Types: 1 Standard drinks or equivalent per week   Drug use: No   Sexual activity: Yes    Birth control/protection: Condom    Comment: 1st intercourse 28 yo-More than 5 partners  Other Topics Concern   Not on file  Social History Narrative   Not on file   Social Drivers of Health   Financial Resource Strain: Low Risk  (05/10/2024)   Overall Financial Resource Strain (CARDIA)    Difficulty of Paying Living Expenses: Not very hard  Food Insecurity: No Food Insecurity (05/10/2024)   Hunger Vital Sign  Worried About Programme Researcher, Broadcasting/film/video in the Last Year: Never true    Ran Out of Food in the Last Year: Never true  Transportation Needs: No Transportation Needs (05/10/2024)   PRAPARE - Administrator, Civil Service (Medical): No    Lack of Transportation (Non-Medical): No  Physical Activity: Sufficiently Active (05/10/2024)   Exercise Vital Sign    Days of Exercise per Week: 7 days    Minutes of Exercise per Session: 30 min  Stress: Stress Concern Present (05/10/2024)   [employer] of Occupational Health - Occupational Stress  Questionnaire    Feeling of Stress: Very much  Social Connections: Moderately Integrated (05/10/2024)   Social Connection and Isolation Panel    Frequency of Communication with Friends and Family: More than three times a week    Frequency of Social Gatherings with Friends and Family: Once a week    Attends Religious Services: 1 to 4 times per year    Active Member of [employer] or Organizations: Yes    Attends Banker Meetings: 1 to 4 times per year    Marital Status: Never married  Intimate Partner Violence: Unknown (01/17/2022)   Received from [facility]   HITS    Physically Hurt: Not on file    Insult or Talk Down To: Not on file    Threaten Physical Harm: Not on file    Scream or Curse: Not on file       Objective:    BP 114/80 (BP Location: Left Arm, Patient Position: Sitting)   Pulse 95   Temp 98.3 F (36.8 C) (Temporal)   Ht 5' 9 (1.753 m)   Wt 280 lb 12.8 oz (127.4 kg)   SpO2 97%   BMI 41.47 kg/m    Physical Exam Vitals and nursing note reviewed.  Constitutional:      General: She is not in acute distress.    Appearance: Normal appearance. She is obese.  HENT:     Head: Normocephalic and atraumatic.     Right Ear: External ear normal.     Left Ear: External ear normal.     Nose: Nose normal.     Mouth/Throat:     Mouth: Mucous membranes are moist.     Pharynx: Oropharynx is clear.  Eyes:     Extraocular Movements: Extraocular movements intact.     Pupils: Pupils are equal, round, and reactive to light.  Cardiovascular:     Rate and Rhythm: Normal rate and regular rhythm.     Pulses: Normal pulses.     Heart sounds: Normal heart sounds.  Pulmonary:     Effort: Pulmonary effort is normal. No respiratory distress.     Breath sounds: Normal breath sounds. No wheezing, rhonchi or rales.  Musculoskeletal:        General: Normal range of motion.     Cervical back: Normal range of motion.     Right lower leg: No edema.     Left lower leg: No edema.   Lymphadenopathy:     Cervical: No cervical adenopathy.  Neurological:     General: No focal deficit present.     Mental Status: She is alert and oriented to person, place, and time.  Psychiatric:        Mood and Affect: Mood normal.        Thought Content: Thought content normal.     No results found for any visits on 05/11/24.      Assessment &  Plan:   Assessment and Plan Assessment & Plan Attention-deficit hyperactivity disorder (ADHD) ADHD with racing thoughts and sleep difficulties. Vyvanse may help. Emphasized breakfast to mitigate appetite suppression and irritability. - Start Vyvanse as prescribed. - Eat breakfast with ADHD medication.  Insomnia Insomnia likely related to ADHD and racing thoughts. ADHD medication may help. - Try melatonin if sleep difficulties persist after starting ADHD medication.  Anxiety and depression Anxiety and depression possibly exacerbated by ADHD. ADHD medication may improve symptoms. Consider SSRI or trazodone  if anxiety persists. - Monitor anxiety and depression after starting ADHD medication. - Consider trazodone  for nighttime anxiety or SSRI like Effexor for daytime anxiety if needed.     No orders of the defined types were placed in this encounter.    No orders of the defined types were placed in this encounter.   Return in about 4 weeks (around 06/08/2024) for sleep.  Corean LITTIE Ku, FNP   "

## 2024-05-11 ENCOUNTER — Encounter: Payer: Self-pay | Admitting: Family Medicine

## 2024-05-11 ENCOUNTER — Ambulatory Visit (INDEPENDENT_AMBULATORY_CARE_PROVIDER_SITE_OTHER): Admitting: Family Medicine

## 2024-05-11 VITALS — BP 114/80 | HR 95 | Temp 98.3°F | Ht 69.0 in | Wt 280.8 lb

## 2024-05-11 DIAGNOSIS — F331 Major depressive disorder, recurrent, moderate: Secondary | ICD-10-CM | POA: Diagnosis not present

## 2024-05-11 DIAGNOSIS — F411 Generalized anxiety disorder: Secondary | ICD-10-CM | POA: Diagnosis not present

## 2024-05-11 DIAGNOSIS — F900 Attention-deficit hyperactivity disorder, predominantly inattentive type: Secondary | ICD-10-CM | POA: Insufficient documentation

## 2024-05-11 NOTE — Patient Instructions (Signed)

## 2024-05-29 ENCOUNTER — Telehealth: Admitting: Physician Assistant

## 2024-05-29 ENCOUNTER — Encounter: Payer: Self-pay | Admitting: Family Medicine

## 2024-05-29 DIAGNOSIS — H6690 Otitis media, unspecified, unspecified ear: Secondary | ICD-10-CM

## 2024-05-29 MED ORDER — AMOXICILLIN 875 MG PO TABS: 875.0000 mg | ORAL_TABLET | Freq: Two times a day (BID) | ORAL | 0 refills | Status: AC

## 2024-05-29 NOTE — Patient Instructions (Signed)
 Waddell Almarie Schilder, thank you for joining [name], PA-C for today's virtual visit.  While this provider is not your primary care provider (PCP), if your PCP is located in our provider database this encounter information will be shared with them immediately following your visit.   A [facility] MyChart account gives you access to today's visit and all your visits, tests, and labs performed at [facility]  click here if you don't have a [facility] MyChart account or go to [email or web address]  Consent: (Patient) Aubriee Szeto provided verbal consent for this virtual visit at the beginning of the encounter.  Current Medications:  Current Outpatient Medications:    amoxicillin  (AMOXIL ) 875 MG tablet, Take 1 tablet (875 mg total) by mouth 2 (two) times daily for 10 days., Disp: 20 tablet, Rfl: 0   lisdexamfetamine (VYVANSE) 20 MG capsule, Take 20 mg by mouth daily., Disp: , Rfl:    Medications ordered in this encounter:  Meds ordered this encounter  Medications   amoxicillin  (AMOXIL ) 875 MG tablet    Sig: Take 1 tablet (875 mg total) by mouth 2 (two) times daily for 10 days.    Dispense:  20 tablet    Refill:  0    Supervising Provider:   LAMPTEY, PHILIP O [[id number]]     *If you need refills on other medications prior to your next appointment, please contact your pharmacy*  Follow-Up: Call back or seek an in-person evaluation if the symptoms worsen or if the condition fails to improve as anticipated.  [facility] [phone]  Other Instructions Otitis Media, Adult  Otitis media is a condition in which the middle ear is red and swollen (inflamed) and full of fluid. The middle ear is the part of the ear that contains bones for hearing as well as air that helps send sounds to the brain. The condition usually goes away on its own. What are the causes? This condition is caused by a blockage in the eustachian tube. This tube  connects the middle ear to the back of the nose. It normally allows air into the middle ear. The blockage is caused by fluid or swelling. Problems that can cause blockage include: A cold or infection that affects the nose, mouth, or throat. Allergies. An irritant, such as tobacco smoke. Adenoids that have become large. The adenoids are soft tissue located in the back of the throat, behind the nose and the roof of the mouth. Growth or swelling in the upper part of the throat, just behind the nose (nasopharynx). Damage to the ear caused by a change in pressure. This is called barotrauma. What increases the risk? You are more likely to develop this condition if you: Smoke or are exposed to tobacco smoke. Have an opening in the roof of your mouth (cleft palate). Have acid reflux. Have problems in your body's defense system (immune system). What are the signs or symptoms? Symptoms of this condition include: Ear pain. Fever. Problems with hearing. Being tired. Fluid leaking from the ear. Ringing in the ear. How is this treated? This condition can go away on its own within 3-5 days. But if the condition is caused by germs (bacteria) and does not go away on its own, or if it keeps coming back, your doctor may: Give you antibiotic medicines. Give you medicines for pain. Follow these instructions at home: Take over-the-counter and prescription medicines only as told by your doctor. If you were prescribed an antibiotic medicine, take it  as told by your doctor. Do not stop taking it even if you start to feel better. Keep all follow-up visits. Contact a doctor if: You have bleeding from your nose. There is a lump on your neck. You are not feeling better in 5 days. You feel worse instead of better. Get help right away if: You have pain that is not helped with medicine. You have swelling, redness, or pain around your ear. You get a stiff neck. You cannot move part of your face  (paralysis). You notice that the bone behind your ear hurts when you touch it. You get a very bad headache. Summary Otitis media means that the middle ear is red, swollen, and full of fluid. This condition usually goes away on its own. If the problem does not go away, treatment may be needed. You may be given medicines to treat the infection or to treat your pain. If you were prescribed an antibiotic medicine, take it as told by your doctor. Do not stop taking it even if you start to feel better. Keep all follow-up visits. This information is not intended to replace advice given to you by your health care provider. Make sure you discuss any questions you have with your health care provider. Document Revised: 01/08/2021 Document Reviewed: 01/08/2021 Elsevier Patient Education  2024 Elsevier Inc.   If you have been instructed to have an in-person evaluation today at a local Urgent Care facility, please use the link below. It will take you to a list of all of our available [facility], including address, phone number and hours of operation. Please do not delay care.  [facility]  If you or a family member do not have a primary care provider, use the link below to schedule a visit and establish care. When you choose a [facility] primary care physician or advanced practice provider, you gain a long-term partner in health. Find a Primary Care Provider  Learn more about [facility]'s in-office and virtual care options:  - Get Care Now

## 2024-05-29 NOTE — Progress Notes (Signed)
 Virtual Visit Consent   [name], you are scheduled for a virtual visit with a [facility] provider today. Just as with appointments in the office, your consent must be obtained to participate. Your consent will be active for this visit and any virtual visit you may have with one of our providers in the next 365 days. If you have a MyChart account, a copy of this consent can be sent to you electronically.  As this is a virtual visit, video technology does not allow for your provider to perform a traditional examination. This may limit your provider's ability to fully assess your condition. If your provider identifies any concerns that need to be evaluated in person or the need to arrange testing (such as labs, EKG, etc.), we will make arrangements to do so. Although advances in technology are sophisticated, we cannot ensure that it will always work on either your end or our end. If the connection with a video visit is poor, the visit may have to be switched to a telephone visit. With either a video or telephone visit, we are not always able to ensure that we have a secure connection.  By engaging in this virtual visit, you consent to the provision of healthcare and authorize for your insurance to be billed (if applicable) for the services provided during this visit. Depending on your insurance coverage, you may receive a charge related to this service.  I need to obtain your verbal consent now. Are you willing to proceed with your visit today? Selena Murray has provided verbal consent on 05/29/2024 for a virtual visit (video or telephone). Selena Murray, [place]  Date: 05/29/2024 11:17 AM   Virtual Visit via Video Note   I, Selena Murray, connected with  Gertrude Bucks  ([id number], [birth year]) on 05/29/24 at 11:15 AM EDT by a video-enabled telemedicine application and verified that I am speaking with the correct person using two identifiers.  Location: Patient:  Virtual Visit Location Patient: Home Provider: Virtual Visit Location Provider: Home Office   I discussed the limitations of evaluation and management by telemedicine and the availability of in person appointments. The patient expressed understanding and agreed to proceed.    History of Present Illness: Selena Murray is a 28 y.o. who identifies as a female who was assigned female at birth, and is being seen today for 2 days of sharp and constant pain in her left ear radiating into her jaw.  Denies noted outer ear swelling or any drainage from the ear.  Does note sensation of fluid behind the ear with some muffled hearing, along with ear pressure.  Denies fever or chills.  Denies other respiratory symptoms.  Does note history of ear infections both as a child and as an adult.  Denies symptoms of her right ear.  Denies any current concerns for pregnancy.  HPI: HPI  Problems:  Patient Active Problem List   Diagnosis Date Noted   Moderate episode of recurrent major depressive disorder (HCC) 05/11/2024   GAD (generalized anxiety disorder) 05/11/2024   ADHD (attention deficit hyperactivity disorder), inattentive type 05/11/2024   Premature labor with rupture of membranes 12/30/2019    Allergies:  Allergies  Allergen Reactions   Gold-Containing Drug Products Other (See Comments)    Advised by allergist   Medications:  Current Outpatient Medications:    amoxicillin  (AMOXIL ) 875 MG tablet, Take 1 tablet (875 mg total) by mouth 2 (two) times daily for 10 days., Disp: 20 tablet, Rfl:  0   lisdexamfetamine (VYVANSE) 20 MG capsule, Take 20 mg by mouth daily., Disp: , Rfl:   Observations/Objective: Patient is well-developed, well-nourished in no acute distress.  Resting comfortably at home.  Head is normocephalic, atraumatic.  No labored breathing.  Speech is clear and coherent with logical content.  Patient is alert and oriented at baseline.    Assessment and Plan: 1. Acute otitis  media, unspecified otitis media type (Primary) - amoxicillin  (AMOXIL ) 875 MG tablet; Take 1 tablet (875 mg total) by mouth 2 (two) times daily for 10 days.  Dispense: 20 tablet; Refill: 0  Supportive measures and OTC medications reviewed.  Start amoxicillin  per orders.  Will require in person evaluation for any nonresolving, new or worsening symptoms despite treatment.  Follow Up Instructions: I discussed the assessment and treatment plan with the patient. The patient was provided an opportunity to ask questions and all were answered. The patient agreed with the plan and demonstrated an understanding of the instructions.  A copy of instructions were sent to the patient via MyChart unless otherwise noted below.   The patient was advised to call back or seek an in-person evaluation if the symptoms worsen or if the condition fails to improve as anticipated.    Selena Velma Lunger, PA-C

## 2024-05-31 ENCOUNTER — Encounter: Payer: Self-pay | Admitting: Family Medicine

## 2024-05-31 ENCOUNTER — Ambulatory Visit: Admitting: Family Medicine

## 2024-05-31 VITALS — BP 110/80 | HR 104 | Temp 98.1°F | Ht 69.0 in | Wt 278.2 lb

## 2024-05-31 DIAGNOSIS — H66015 Acute suppurative otitis media with spontaneous rupture of ear drum, recurrent, left ear: Secondary | ICD-10-CM | POA: Insufficient documentation

## 2024-05-31 DIAGNOSIS — Z6791 Unspecified blood type, Rh negative: Secondary | ICD-10-CM | POA: Insufficient documentation

## 2024-05-31 MED ORDER — CIPROFLOXACIN-DEXAMETHASONE 0.3-0.1 % OT SUSP: 4.0000 [drp] | Freq: Two times a day (BID) | OTIC | 1 refills | Status: AC

## 2024-05-31 NOTE — Progress Notes (Signed)
 Acute Office Visit  Subjective:     Patient ID: Selena Murray, female    DOB: [birth year], 28 y.o.   MRN: [id number]  Chief Complaint  Patient presents with   Ear Pain    Left ear pain. Patient seen virtually on Saturday and they gave her an oral antibiotic but she feels like it's getting worst.     HPI  Discussed the use of AI scribe software for clinical note transcription with the patient, who gave verbal consent to proceed.  History of Present Illness Selena Murray is a 28 year old female who presents with recurrent ear infections.  Recurrent otitis media - Experiences ear infections at least annually - Concerned about the cost of medications for treatment - Open to considering alternative therapies, including separate antibiotic and steroid regimens - Had video visit on Saturday, has been taking amoxicillin  since then with no relief.     ROS Per HPI      Objective:    BP 110/80 (BP Location: Left Arm, Patient Position: Sitting, Cuff Size: Large)   Pulse (!) 104   Temp 98.1 F (36.7 C) (Oral)   Ht 5' 9 (1.753 m)   Wt 278 lb 3.2 oz (126.2 kg)   LMP 05/11/2024   SpO2 99%   BMI 41.08 kg/m    Physical Exam Vitals and nursing note reviewed.  Constitutional:      General: She is not in acute distress.    Appearance: Normal appearance.  HENT:     Head: Normocephalic and atraumatic.     Right Ear: External ear normal. No drainage, swelling or tenderness. No middle ear effusion.     Left Ear: External ear normal. Drainage, swelling and tenderness present.     Ears:      Comments: Area of erythema, swelling, tenderness. Cannot tolerate speculum exam r/t pain so cannot visualize L TM. Thicker orange discharge from the L ear, no bleeding noted    Nose: Nose normal.     Mouth/Throat:     Mouth: Mucous membranes are moist.     Pharynx: Oropharynx is clear.  Eyes:     Extraocular Movements: Extraocular movements intact.     Pupils: Pupils are  equal, round, and reactive to light.  Cardiovascular:     Rate and Rhythm: Normal rate and regular rhythm.     Pulses: Normal pulses.     Heart sounds: Normal heart sounds.  Pulmonary:     Effort: Pulmonary effort is normal. No respiratory distress.     Breath sounds: Normal breath sounds. No wheezing, rhonchi or rales.  Musculoskeletal:        General: Normal range of motion.     Cervical back: Normal range of motion.     Right lower leg: No edema.     Left lower leg: No edema.  Lymphadenopathy:     Cervical: No cervical adenopathy.  Neurological:     General: No focal deficit present.     Mental Status: She is alert and oriented to person, place, and time.  Psychiatric:        Mood and Affect: Mood normal.        Thought Content: Thought content normal.     No results found for any visits on 05/31/24.      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  Current episode involves internal and external ear infection. - Prescribed Ciprodex , 4 drops in left ear twice daily for 7 days. - Continue Amoxicillin  - Placed a refill on the prescription for future occurrences. - Advised her to call if medication is too expensive or if there are prescription issues.     No orders of the defined types were placed in this encounter.    Meds ordered this encounter  Medications   ciprofloxacin -dexamethasone  (CIPRODEX ) OTIC suspension    Sig: Place 4 drops into the left ear 2 (two) times daily for 7 days.    Dispense:  7.5 mL    Refill:  1    Return if symptoms worsen or fail to improve.  Selena LITTIE Ku, FNP

## 2024-05-31 NOTE — Patient Instructions (Signed)
 I have sent in Ciprodex  drops for you to use 4 drops twice a day to the affected ear for 7 days.   May continue amoxicillin .  Follow-up with me for new or worsening symptoms.

## 2024-06-24 ENCOUNTER — Encounter: Payer: Self-pay | Admitting: Family Medicine

## 2024-06-24 DIAGNOSIS — F411 Generalized anxiety disorder: Secondary | ICD-10-CM

## 2024-06-24 DIAGNOSIS — F5101 Primary insomnia: Secondary | ICD-10-CM | POA: Insufficient documentation

## 2024-06-24 DIAGNOSIS — F331 Major depressive disorder, recurrent, moderate: Secondary | ICD-10-CM

## 2024-06-24 MED ORDER — TRAZODONE HCL 50 MG PO TABS: 50.0000 mg | ORAL_TABLET | Freq: Every day | ORAL | 1 refills | Status: DC

## 2024-07-13 ENCOUNTER — Encounter: Payer: Self-pay | Admitting: Family Medicine

## 2024-07-13 ENCOUNTER — Ambulatory Visit: Admitting: Family Medicine

## 2024-07-13 VITALS — BP 122/86 | HR 98 | Temp 98.3°F | Ht 69.0 in | Wt 281.0 lb

## 2024-07-13 DIAGNOSIS — J208 Acute bronchitis due to other specified organisms: Secondary | ICD-10-CM | POA: Diagnosis not present

## 2024-07-13 DIAGNOSIS — B9689 Other specified bacterial agents as the cause of diseases classified elsewhere: Secondary | ICD-10-CM | POA: Diagnosis not present

## 2024-07-13 MED ORDER — AZITHROMYCIN 250 MG PO TABS: ORAL_TABLET | ORAL | 0 refills | Status: AC

## 2024-07-13 MED ORDER — PROMETHAZINE-DM 6.25-15 MG/5ML PO SYRP: 5.0000 mL | ORAL_SOLUTION | Freq: Four times a day (QID) | ORAL | 0 refills | Status: AC | PRN

## 2024-07-13 NOTE — Progress Notes (Signed)
 Acute Office Visit  Subjective:     Patient ID: Selena Murray, female    DOB: 05/29/1996, 28 y.o.   MRN: [id number]  Chief Complaint  Patient presents with   Cough    Productive cough, sinus pressure, sore throat, post nasal drip, redness in right eye, fever for 2 weeks. Currently treating with mucinex, sudafed    HPI  Discussed the use of AI scribe software for clinical note transcription with the patient, who gave verbal consent to proceed.  History of Present Illness Selena Murray is a 28 year old female who presents with a persistent cough and sore throat.  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  Constitutional:      General: She is not in acute distress.    Appearance: She is ill-appearing.  HENT:     Head: Normocephalic and atraumatic.     Right Ear: Tympanic membrane, ear canal and external ear normal.     Left Ear: Tympanic membrane, ear canal and external ear normal.     Nose: Congestion present.     Mouth/Throat:     Mouth: Mucous membranes are moist.     Comments: Oropharyngeal cobblestoning   Eyes:     Extraocular Movements: Extraocular  movements intact.     Conjunctiva/sclera:     Right eye: Right conjunctiva is injected. No chemosis, exudate or hemorrhage.    Left eye: Left conjunctiva is not injected. No chemosis, exudate or hemorrhage.    Pupils: Pupils are equal, round, and reactive to light.  Cardiovascular:     Rate and Rhythm: Normal rate and regular rhythm.     Pulses: Normal pulses.     Heart sounds: Normal heart sounds.  Pulmonary:     Effort: Pulmonary effort is normal. No respiratory distress.     Breath sounds: Normal breath sounds. No wheezing, rhonchi or rales.     Comments: Dry cough in office Musculoskeletal:        General: Normal range of motion.     Cervical back: Normal range of motion.     Right lower leg: No edema.     Left lower leg: No edema.  Lymphadenopathy:     Cervical: Cervical adenopathy present.  Neurological:     General: No focal deficit present.     Mental Status: She is alert and oriented to person, place, and time.  Psychiatric:        Mood and Affect: Mood normal.        Thought Content: Thought content normal.     No results found for any  visits on 07/13/24.      Assessment & Plan:   Assessment and Plan Assessment & Plan Acute bacterial bronchitis Symptoms suggest secondary bacterial infection post-viral URI. COVID and flu unlikely. - Prescribed azithromycin  (Z-Pak): 2 tablets on day 1, then 1 tablet daily for 4 days. - Prescribed cough syrup every 6 hours as needed, cautioning about drowsiness. - Advised to monitor symptoms and report if no improvement by Thursday or Friday.     No orders of the defined types were placed in this encounter.    Meds ordered this encounter  Medications   azithromycin  (ZITHROMAX ) 250 MG tablet    Sig: Take 2 tablets on day 1, then 1 tablet daily on days 2 through 5    Dispense:  6 tablet    Refill:  0   promethazine -dextromethorphan (PROMETHAZINE -DM) 6.25-15 MG/5ML syrup    Sig: Take 5 mLs by mouth 4 (four) times daily as  needed for cough.    Dispense:  118 mL    Refill:  0    Return if symptoms worsen or fail to improve.  Corean LITTIE Ku, FNP

## 2024-07-13 NOTE — Patient Instructions (Signed)
 I have sent in azithromycin for you to take.  Take 2 tablets today, then 1 tablet daily for the next 4 days.  I have sent in hydrocodone cough syrup for you to take 5 mL once daily in the evening as needed for cough.  This medication may make you sleepy.  Do not drive or operate heavy machinery while taking this medication.  Follow-up with me for new or worsening symptoms.

## 2024-08-17 ENCOUNTER — Other Ambulatory Visit: Payer: Self-pay | Admitting: Family Medicine

## 2024-08-17 DIAGNOSIS — F411 Generalized anxiety disorder: Secondary | ICD-10-CM

## 2024-08-17 DIAGNOSIS — F5101 Primary insomnia: Secondary | ICD-10-CM

## 2024-08-17 DIAGNOSIS — F331 Major depressive disorder, recurrent, moderate: Secondary | ICD-10-CM

## 2024-09-13 ENCOUNTER — Other Ambulatory Visit: Payer: Self-pay | Admitting: Family Medicine

## 2024-09-13 DIAGNOSIS — F5101 Primary insomnia: Secondary | ICD-10-CM

## 2024-09-13 DIAGNOSIS — F331 Major depressive disorder, recurrent, moderate: Secondary | ICD-10-CM

## 2024-09-13 DIAGNOSIS — F411 Generalized anxiety disorder: Secondary | ICD-10-CM

## 2024-10-15 ENCOUNTER — Other Ambulatory Visit: Payer: Self-pay | Admitting: Family Medicine

## 2024-10-15 DIAGNOSIS — F331 Major depressive disorder, recurrent, moderate: Secondary | ICD-10-CM

## 2024-10-15 DIAGNOSIS — F411 Generalized anxiety disorder: Secondary | ICD-10-CM

## 2024-10-15 DIAGNOSIS — F5101 Primary insomnia: Secondary | ICD-10-CM
